# Patient Record
Sex: Female | Born: 1995 | Race: Black or African American | Hispanic: No | Marital: Married | Smoking: Never smoker
Health system: Southern US, Community
[De-identification: ages and names within clinical notes are randomized; demographics above are authoritative.]

## PROBLEM LIST (undated history)

## (undated) ENCOUNTER — Inpatient Hospital Stay (HOSPITAL_COMMUNITY): Payer: Self-pay

## (undated) DIAGNOSIS — I10 Essential (primary) hypertension: Secondary | ICD-10-CM

## (undated) DIAGNOSIS — Z789 Other specified health status: Secondary | ICD-10-CM

## (undated) HISTORY — PX: DILATION AND CURETTAGE OF UTERUS: SHX78

## (undated) HISTORY — DX: Essential (primary) hypertension: I10

---

## 2014-11-07 LAB — OB RESULTS CONSOLE HGB/HCT, BLOOD: Hemoglobin: 12.7 g/dL

## 2014-11-07 LAB — OB RESULTS CONSOLE ANTIBODY SCREEN: Antibody Screen: NEGATIVE

## 2014-11-07 LAB — OB RESULTS CONSOLE ABO/RH: RH TYPE: POSITIVE

## 2015-09-10 NOTE — L&D Delivery Note (Signed)
Delivery Note At 3:48 PM a viable female was delivered via  (Presentation:vertex ;LOA  ).  APGAR:8 , 9; weight  .   Placenta status:spont ,via shultz .  Cord: 3 vc with the following complications: none.  Cord pH: n/a  Anesthesia:  none Episiotomy:  none Lacerations: none  Suture Repair: n/a Est. Blood Loss 200 (mL):    Mom to postpartum.  Baby to Couplet care / Skin to Skin.  [name] 07/06/2016, 3:56 PM

## 2015-11-16 LAB — OB RESULTS CONSOLE HGB/HCT, BLOOD
HEMATOCRIT: 38 %
HEMOGLOBIN: 12.7 g/dL

## 2015-11-16 LAB — OB RESULTS CONSOLE HEPATITIS B SURFACE ANTIGEN: Hepatitis B Surface Ag: NEGATIVE

## 2015-11-16 LAB — OB RESULTS CONSOLE HIV ANTIBODY (ROUTINE TESTING): HIV: NONREACTIVE

## 2015-11-16 LAB — OB RESULTS CONSOLE ANTIBODY SCREEN: Antibody Screen: NEGATIVE

## 2015-11-16 LAB — OB RESULTS CONSOLE ABO/RH: RH TYPE: POSITIVE

## 2015-11-16 LAB — OB RESULTS CONSOLE RPR: RPR: NONREACTIVE

## 2015-11-16 LAB — OB RESULTS CONSOLE PLATELET COUNT: Platelets: 297 10*3/uL

## 2015-11-16 LAB — OB RESULTS CONSOLE RUBELLA ANTIBODY, IGM: Rubella: IMMUNE

## 2016-02-14 ENCOUNTER — Encounter: Payer: Self-pay | Admitting: Certified Nurse Midwife

## 2016-02-14 ENCOUNTER — Ambulatory Visit (INDEPENDENT_AMBULATORY_CARE_PROVIDER_SITE_OTHER): Payer: Medicaid Other | Admitting: Certified Nurse Midwife

## 2016-02-14 VITALS — BP 137/82 | HR 105 | Wt 207.0 lb

## 2016-02-14 DIAGNOSIS — Z3482 Encounter for supervision of other normal pregnancy, second trimester: Secondary | ICD-10-CM

## 2016-02-14 DIAGNOSIS — O0990 Supervision of high risk pregnancy, unspecified, unspecified trimester: Secondary | ICD-10-CM | POA: Insufficient documentation

## 2016-02-14 LAB — POCT URINALYSIS DIPSTICK
BILIRUBIN UA: NEGATIVE
Blood, UA: NEGATIVE
Glucose, UA: NEGATIVE
Ketones, UA: NEGATIVE
Leukocytes, UA: NEGATIVE
Nitrite, UA: NEGATIVE
PH UA: 5
PROTEIN UA: NEGATIVE
Spec Grav, UA: 1.015
Urobilinogen, UA: NEGATIVE

## 2016-02-14 MED ORDER — VITAFOL GUMMIES 3.33-0.333-34.8 MG PO CHEW: 3.0000 | CHEWABLE_TABLET | Freq: Every day | ORAL | Status: DC

## 2016-02-14 NOTE — Addendum Note (Signed)
Addended by: Marya LandryFOSTER, Theodora Lalanne D on: 02/14/2016 04:03 PM   Modules accepted: Orders

## 2016-02-14 NOTE — Progress Notes (Signed)
Subjective:    [name] is being seen today for her first obstetrical visit.  This is a planned pregnancy. She is at 885w5d gestation. Her obstetrical history is significant for obesity. Relationship with FOB: spouse, living together. Patient does intend to breast feed. Pregnancy history fully reviewed.  The information documented in the HPI was reviewed and verified.  Menstrual History: OB History    Gravida Para Term Preterm AB TAB SAB Ectopic Multiple Living   2 0   1  1   0       Patient's last menstrual period was 09/20/2015.    History reviewed. No pertinent past medical history.  Past Surgical History  Procedure Laterality Date  . Dilation and curettage of uterus       (Not in a hospital admission) Not on File  Social History  Substance Use Topics  . Smoking status: Never Smoker   . Smokeless tobacco: Not on file  . Alcohol Use: No    History reviewed. No pertinent family history.   Review of Systems Constitutional: negative for weight loss Gastrointestinal: negative for vomiting Genitourinary:negative for genital lesions and vaginal discharge and dysuria Musculoskeletal:negative for back pain Behavioral/Psych: negative for abusive relationship, depression, illegal drug usage and tobacco use    Objective:    BP 137/82 mmHg  Pulse 105  Wt 207 lb (93.895 kg)  LMP 09/20/2015 General Appearance:    Alert, cooperative, no distress, appears stated age  Head:    Normocephalic, without obvious abnormality, atraumatic  Eyes:    PERRL, conjunctiva/corneas clear, EOM's intact, fundi    benign, both eyes  Ears:    Normal TM's and external ear canals, both ears  Nose:   Nares normal, septum midline, mucosa normal, no drainage    or sinus tenderness  Throat:   Lips, mucosa, and tongue normal; teeth and gums normal  Neck:   Supple, symmetrical, trachea midline, no adenopathy;    thyroid:  no enlargement/tenderness/nodules; no carotid   bruit or JVD  Back:      Symmetric, no curvature, ROM normal, no CVA tenderness  Lungs:     Clear to auscultation bilaterally, respirations unlabored  Chest Wall:    No tenderness or deformity   Heart:    Regular rate and rhythm, S1 and S2 normal, no murmur, rub   or gallop  Breast Exam:    No tenderness, masses, or nipple abnormality  Abdomen:     Soft, non-tender, bowel sounds active all four quadrants,    no masses, no organomegaly  Genitalia:    Normal female without lesion, discharge or tenderness  Extremities:   Extremities normal, atraumatic, no cyanosis or edema  Pulses:   2+ and symmetric all extremities  Skin:   Skin color, texture, turgor normal, no rashes or lesions  Lymph nodes:   Cervical, supraclavicular, and axillary nodes normal  Neurologic:   CNII-XII intact, normal strength, sensation and reflexes    throughout      FHR: 155 by doppler.  FH@U .     Lab Review Urine pregnancy test Labs reviewed yes Radiologic studies reviewed yes Assessment:    Pregnancy at [redacted]w[redacted]d weeks   Transfer at 20 weeks from [place]  Plan:     Prenatal vitamins.  Counseling provided regarding continued use of seat belts, cessation of alcohol consumption, smoking or use of illicit drugs; infection precautions i.e., influenza/TDAP immunizations, toxoplasmosis,CMV, parvovirus, listeria and varicella; workplace safety, exercise during pregnancy; routine dental care, safe medications, sexual activity, hot tubs,  saunas, pools, travel, caffeine use, fish and methlymercury, potential toxins, hair treatments, varicose veins Weight gain recommendations per IOM guidelines reviewed: underweight/BMI< 18.5--> gain 28 - 40 lbs; normal weight/BMI 18.5 - 24.9--> gain 25 - 35 lbs; overweight/BMI 25 - 29.9--> gain 15 - 25 lbs; obese/BMI >30->gain  11 - 20 lbs Problem list reviewed and updated. FIRST/CF mutation testing/NIPT/QUAD SCREEN/fragile X/Ashkenazi Jewish population testing/Spinal muscular atrophy discussed: results reviewed. Role of  ultrasound in pregnancy discussed; fetal survey: ordered. Amniocentesis discussed: not indicated. VBAC calculator score: VBAC consent form provided Meds ordered this encounter  Medications  . prenatal vitamin w/FE, FA (PRENATAL 1 + 1) 27-1 MG TABS tablet    Sig: Take 1 tablet by mouth daily at 12 noon.  . Prenatal Vit-Fe Phos-FA-Omega (VITAFOL GUMMIES) 3.33-0.333-34.8 MG CHEW    Sig: Chew 3 tablets by mouth daily.    Dispense:  90 tablet    Refill:  12   Orders Placed This Encounter  Procedures  . [place] MFM OB COMP + 14 WK    Standing Status: Future     Number of Occurrences:      Standing Expiration Date: 04/15/2017    Order Specific Question:  Reason for Exam (SYMPTOM  OR DIAGNOSIS REQUIRED)    Answer:  fetal anatomy scan, transfer from [place], obesity    Order Specific Question:  Preferred imaging location?    Answer:  [facility]    Follow up in 4 weeks. 50% of 30 min visit spent on counseling and coordination of care.

## 2016-02-14 NOTE — Patient Instructions (Signed)

## 2016-02-19 ENCOUNTER — Other Ambulatory Visit: Payer: Self-pay | Admitting: Certified Nurse Midwife

## 2016-02-19 ENCOUNTER — Ambulatory Visit (HOSPITAL_COMMUNITY)
Admission: RE | Admit: 2016-02-19 | Discharge: 2016-02-19 | Disposition: A | Discharge status: Home / Self Care | Payer: Medicaid Other | Source: Ambulatory Visit | Attending: Certified Nurse Midwife | Admitting: Certified Nurse Midwife

## 2016-02-19 DIAGNOSIS — Z3482 Encounter for supervision of other normal pregnancy, second trimester: Secondary | ICD-10-CM

## 2016-02-19 DIAGNOSIS — Z3A20 20 weeks gestation of pregnancy: Secondary | ICD-10-CM | POA: Insufficient documentation

## 2016-02-19 DIAGNOSIS — Z3689 Encounter for other specified antenatal screening: Secondary | ICD-10-CM

## 2016-02-19 DIAGNOSIS — O99212 Obesity complicating pregnancy, second trimester: Secondary | ICD-10-CM

## 2016-02-19 DIAGNOSIS — Z36 Encounter for antenatal screening of mother: Secondary | ICD-10-CM | POA: Insufficient documentation

## 2016-02-20 ENCOUNTER — Encounter: Payer: Self-pay | Admitting: *Deleted

## 2016-02-21 ENCOUNTER — Other Ambulatory Visit: Payer: Self-pay | Admitting: Certified Nurse Midwife

## 2016-02-26 ENCOUNTER — Telehealth: Payer: Self-pay | Admitting: *Deleted

## 2016-02-26 NOTE — Telephone Encounter (Signed)
Patient states she had a US 2 weeks ago and has not heard anything about it. 3:50 Call to patient- told her everything looks good. She does have a low lying posterior placenta (1.5 cm away from os) and I will check with Boykin ReaperRachelle to see about her activity. She will need to have a repeat US to check it's placement in a few weeks.

## 2016-02-27 ENCOUNTER — Other Ambulatory Visit: Payer: Self-pay | Admitting: Certified Nurse Midwife

## 2016-02-27 NOTE — Telephone Encounter (Signed)
Please tell her no sexual intercourse until the cervix moves away from the OS.  Thank you.  R.Denney CNM

## 2016-02-29 NOTE — Telephone Encounter (Signed)
Patient ask when the next US will be scheduled- told her 6-8 weeks after the last one. Told her I would have you put the order in.

## 2016-03-01 ENCOUNTER — Other Ambulatory Visit: Payer: Self-pay | Admitting: Certified Nurse Midwife

## 2016-03-01 DIAGNOSIS — O0992 Supervision of high risk pregnancy, unspecified, second trimester: Secondary | ICD-10-CM

## 2016-03-01 NOTE — Telephone Encounter (Signed)
Please let her know that the F/U us has been ordered and that Lesle ReekBarb will be giving her a call.  Thank you.  R.Eloisa Chokshi CNM

## 2016-03-13 ENCOUNTER — Ambulatory Visit (INDEPENDENT_AMBULATORY_CARE_PROVIDER_SITE_OTHER): Payer: Medicaid Other | Admitting: Certified Nurse Midwife

## 2016-03-13 VITALS — BP 123/76 | HR 87 | Wt 217.0 lb

## 2016-03-13 DIAGNOSIS — Z3482 Encounter for supervision of other normal pregnancy, second trimester: Secondary | ICD-10-CM

## 2016-03-13 LAB — POCT URINALYSIS DIPSTICK
BILIRUBIN UA: NEGATIVE
GLUCOSE UA: NEGATIVE
KETONES UA: NEGATIVE
Leukocytes, UA: NEGATIVE
Nitrite, UA: NEGATIVE
PH UA: 6
RBC UA: NEGATIVE
Spec Grav, UA: 1.02
Urobilinogen, UA: NEGATIVE

## 2016-03-13 NOTE — Progress Notes (Signed)
Subjective:    [name] is a 20 y.o. female being seen today for her obstetrical visit. She is at 3120w5d gestation. Patient reports: no complaints . Fetal movement: normal.  Problem List Items Addressed This Visit      Other   Encounter for supervision of other normal pregnancy in second trimester - Primary   Relevant Orders   US MFM OB FOLLOW UP     Patient Active Problem List   Diagnosis Date Noted  . Encounter for supervision of other normal pregnancy in second trimester 02/14/2016   Objective:    BP 123/76 mmHg  Pulse 87  Wt 217 lb (98.431 kg)  LMP 09/20/2015 FHT: 155 BPM  Uterine Size: 27 cm and size greater than dates     Assessment:    Pregnancy @ 4020w5d    Placenta previa  S>D  Plan:    OBGCT: discussed and ordered for next visit. Signs and symptoms of preterm labor: discussed.  Labs, problem list reviewed and updated 2 hr GTT planned Follow up in 4 weeks.

## 2016-03-13 NOTE — Addendum Note (Signed)
Addended by: Marya LandryFOSTER, Ethal Gotay D on: 03/13/2016 05:20 PM   Modules accepted: Orders

## 2016-03-16 ENCOUNTER — Encounter (HOSPITAL_COMMUNITY): Payer: Self-pay

## 2016-03-16 ENCOUNTER — Inpatient Hospital Stay (HOSPITAL_COMMUNITY)
Admission: AD | Admit: 2016-03-16 | Discharge: 2016-03-16 | Disposition: A | Discharge status: Home / Self Care | Payer: Medicaid Other | Source: Ambulatory Visit | Attending: Obstetrics and Gynecology | Admitting: Obstetrics and Gynecology

## 2016-03-16 DIAGNOSIS — O26892 Other specified pregnancy related conditions, second trimester: Secondary | ICD-10-CM | POA: Diagnosis not present

## 2016-03-16 DIAGNOSIS — Z3A24 24 weeks gestation of pregnancy: Secondary | ICD-10-CM | POA: Insufficient documentation

## 2016-03-16 DIAGNOSIS — O26899 Other specified pregnancy related conditions, unspecified trimester: Secondary | ICD-10-CM

## 2016-03-16 DIAGNOSIS — R109 Unspecified abdominal pain: Secondary | ICD-10-CM | POA: Diagnosis not present

## 2016-03-16 DIAGNOSIS — O368120 Decreased fetal movements, second trimester, not applicable or unspecified: Secondary | ICD-10-CM | POA: Insufficient documentation

## 2016-03-16 DIAGNOSIS — O9989 Other specified diseases and conditions complicating pregnancy, childbirth and the puerperium: Secondary | ICD-10-CM | POA: Diagnosis not present

## 2016-03-16 LAB — URINALYSIS, ROUTINE W REFLEX MICROSCOPIC
Bilirubin Urine: NEGATIVE
GLUCOSE, UA: NEGATIVE mg/dL
HGB URINE DIPSTICK: NEGATIVE
Ketones, ur: NEGATIVE mg/dL
Leukocytes, UA: NEGATIVE
Nitrite: NEGATIVE
PH: 7 (ref 5.0–8.0)
PROTEIN: NEGATIVE mg/dL
Specific Gravity, Urine: 1.015 (ref 1.005–1.030)

## 2016-03-16 NOTE — MAU Note (Signed)
Has had decreased fetal movement for 2 days. Slight cramping 4/10

## 2016-03-16 NOTE — MAU Provider Note (Signed)
History   G2P0010 @ 24.1 wks in with abd cramping for several days. Also decreased fetal movement  CSN: [id number]  Arrival date & time 03/16/16  1558   First Provider Initiated Contact with Patient 03/16/16 1631      Chief Complaint  Patient presents with  . Decreased Fetal Movement  . Abdominal Cramping    HPI  History reviewed. No pertinent past medical history.  Past Surgical History  Procedure Laterality Date  . Dilation and curettage of uterus      History reviewed. No pertinent family history.  Social History  Substance Use Topics  . Smoking status: Never Smoker   . Smokeless tobacco: None  . Alcohol Use: No    OB History    Gravida Para Term Preterm AB TAB SAB Ectopic Multiple Living   2 0   1  1   0      Review of Systems  Constitutional: Negative.   HENT: Negative.   Eyes: Negative.   Respiratory: Negative.   Cardiovascular: Negative.   Gastrointestinal: Positive for abdominal pain.  Endocrine: Negative.   Genitourinary: Negative.   Musculoskeletal: Negative.   Skin: Negative.   Allergic/Immunologic: Negative.   Neurological: Negative.   Hematological: Negative.   Psychiatric/Behavioral: Negative.     Allergies  Review of patient's allergies indicates no known allergies.  Home Medications  No current outpatient prescriptions on file.  Temp(Src) 98.7 F (37.1 C) (Oral)  Resp 16  LMP 09/20/2015  Physical Exam  Constitutional: She is oriented to person, place, and time. She appears well-developed and well-nourished.  HENT:  Head: Normocephalic.  Eyes: Pupils are equal, round, and reactive to light.  Neck: Normal range of motion.  Cardiovascular: Normal rate, regular rhythm, normal heart sounds and intact distal pulses.   Pulmonary/Chest: Effort normal and breath sounds normal.  Abdominal: Soft. Bowel sounds are normal.  Genitourinary: Vagina normal and uterus normal.  Musculoskeletal: Normal range of motion.  Neurological: She is  alert and oriented to person, place, and time. She has normal reflexes.  Skin: Skin is warm and dry.  Psychiatric: She has a normal mood and affect. Her behavior is normal. Judgment and thought content normal.    MAU Course  Procedures (including critical care time)  Labs Reviewed  URINALYSIS, ROUTINE W REFLEX MICROSCOPIC (NOT AT [facility])   No results found.   No diagnosis found.    MDM  FHR pattern reassurring. SVE cl/firm/th/post/high. No uc's will d/c home pt instructed that if pain worsens or fetus does not move after drinking something sweet to return to MAU

## 2016-03-16 NOTE — Discharge Instructions (Signed)

## 2016-03-25 NOTE — Telephone Encounter (Signed)
Patient scheduled 7/18 for [place]

## 2016-03-26 ENCOUNTER — Encounter (HOSPITAL_COMMUNITY): Payer: Self-pay

## 2016-03-26 ENCOUNTER — Ambulatory Visit (HOSPITAL_COMMUNITY)
Admission: RE | Admit: 2016-03-26 | Discharge: 2016-03-26 | Disposition: A | Discharge status: Home / Self Care | Payer: Medicaid Other | Source: Ambulatory Visit | Attending: Certified Nurse Midwife | Admitting: Certified Nurse Midwife

## 2016-03-26 ENCOUNTER — Other Ambulatory Visit: Payer: Self-pay | Admitting: Certified Nurse Midwife

## 2016-03-26 VITALS — BP 119/67 | HR 88 | Wt 224.1 lb

## 2016-03-26 DIAGNOSIS — O99212 Obesity complicating pregnancy, second trimester: Secondary | ICD-10-CM | POA: Diagnosis not present

## 2016-03-26 DIAGNOSIS — O0992 Supervision of high risk pregnancy, unspecified, second trimester: Secondary | ICD-10-CM

## 2016-03-26 DIAGNOSIS — Z3A25 25 weeks gestation of pregnancy: Secondary | ICD-10-CM | POA: Insufficient documentation

## 2016-03-26 DIAGNOSIS — O365990 Maternal care for other known or suspected poor fetal growth, unspecified trimester, not applicable or unspecified: Secondary | ICD-10-CM

## 2016-03-26 DIAGNOSIS — O4442 Low lying placenta NOS or without hemorrhage, second trimester: Secondary | ICD-10-CM | POA: Insufficient documentation

## 2016-04-02 ENCOUNTER — Other Ambulatory Visit (HOSPITAL_COMMUNITY): Payer: Self-pay | Admitting: Maternal and Fetal Medicine

## 2016-04-02 ENCOUNTER — Ambulatory Visit (HOSPITAL_COMMUNITY)
Admission: RE | Admit: 2016-04-02 | Discharge: 2016-04-02 | Disposition: A | Discharge status: Home / Self Care | Payer: Medicaid Other | Source: Ambulatory Visit | Attending: Certified Nurse Midwife | Admitting: Certified Nurse Midwife

## 2016-04-02 ENCOUNTER — Encounter (HOSPITAL_COMMUNITY): Payer: Self-pay

## 2016-04-02 DIAGNOSIS — O365929 Maternal care for other known or suspected poor fetal growth, second trimester, other fetus: Secondary | ICD-10-CM

## 2016-04-02 DIAGNOSIS — O365920 Maternal care for other known or suspected poor fetal growth, second trimester, not applicable or unspecified: Secondary | ICD-10-CM | POA: Insufficient documentation

## 2016-04-02 DIAGNOSIS — O365990 Maternal care for other known or suspected poor fetal growth, unspecified trimester, not applicable or unspecified: Secondary | ICD-10-CM

## 2016-04-02 DIAGNOSIS — O99212 Obesity complicating pregnancy, second trimester: Secondary | ICD-10-CM | POA: Diagnosis not present

## 2016-04-02 DIAGNOSIS — Z3A26 26 weeks gestation of pregnancy: Secondary | ICD-10-CM | POA: Diagnosis not present

## 2016-04-09 ENCOUNTER — Encounter (HOSPITAL_COMMUNITY): Payer: Self-pay

## 2016-04-09 ENCOUNTER — Ambulatory Visit (HOSPITAL_COMMUNITY)
Admission: RE | Admit: 2016-04-09 | Discharge: 2016-04-09 | Disposition: A | Discharge status: Home / Self Care | Payer: Medicaid Other | Source: Ambulatory Visit | Attending: Certified Nurse Midwife | Admitting: Certified Nurse Midwife

## 2016-04-09 DIAGNOSIS — O365990 Maternal care for other known or suspected poor fetal growth, unspecified trimester, not applicable or unspecified: Secondary | ICD-10-CM

## 2016-04-09 DIAGNOSIS — Z3A27 27 weeks gestation of pregnancy: Secondary | ICD-10-CM | POA: Diagnosis not present

## 2016-04-09 DIAGNOSIS — O365930 Maternal care for other known or suspected poor fetal growth, third trimester, not applicable or unspecified: Secondary | ICD-10-CM | POA: Insufficient documentation

## 2016-04-09 DIAGNOSIS — O99212 Obesity complicating pregnancy, second trimester: Secondary | ICD-10-CM | POA: Diagnosis not present

## 2016-04-10 ENCOUNTER — Ambulatory Visit (INDEPENDENT_AMBULATORY_CARE_PROVIDER_SITE_OTHER): Payer: Medicaid Other | Admitting: Certified Nurse Midwife

## 2016-04-10 ENCOUNTER — Other Ambulatory Visit: Payer: Medicaid Other

## 2016-04-10 VITALS — BP 122/78 | HR 80 | Temp 98.9°F | Wt 228.0 lb

## 2016-04-10 DIAGNOSIS — Z3402 Encounter for supervision of normal first pregnancy, second trimester: Secondary | ICD-10-CM

## 2016-04-10 DIAGNOSIS — Z3482 Encounter for supervision of other normal pregnancy, second trimester: Secondary | ICD-10-CM

## 2016-04-10 DIAGNOSIS — O365990 Maternal care for other known or suspected poor fetal growth, unspecified trimester, not applicable or unspecified: Secondary | ICD-10-CM | POA: Insufficient documentation

## 2016-04-10 DIAGNOSIS — O365933 Maternal care for other known or suspected poor fetal growth, third trimester, fetus 3: Secondary | ICD-10-CM

## 2016-04-10 LAB — POCT URINALYSIS DIPSTICK
BILIRUBIN UA: NEGATIVE
Blood, UA: NEGATIVE
GLUCOSE UA: NEGATIVE
KETONES UA: NEGATIVE
Nitrite, UA: NEGATIVE
Protein, UA: NEGATIVE
Spec Grav, UA: 1.015
Urobilinogen, UA: 0.2
pH, UA: 5

## 2016-04-10 NOTE — Progress Notes (Signed)
Patient reports she has no concerns today 

## 2016-04-10 NOTE — Progress Notes (Signed)
Subjective:  [name] is a 20 y.o. G2P0010 at [redacted]w[redacted]d being seen today for ongoing prenatal care.  She is currently monitored for the following issues for this low-risk pregnancy and has Encounter for supervision of other normal pregnancy in second trimester on her problem list.  Patient reports no complaints.  Contractions: Not present. Vag. Bleeding: None.  Movement: Present. Denies leaking of fluid.   The following portions of the patient's history were reviewed and updated as appropriate: allergies, current medications, past family history, past medical history, past social history, past surgical history and problem list. Problem list updated.  Objective:   Vitals:   04/10/16 0955  BP: 122/78  Pulse: 80  Temp: 98.9 F (37.2 C)  Weight: 228 lb (103.4 kg)    Fetal Status: Fetal Heart Rate (bpm): 152 Fundal Height: 27 cm Movement: Present     General:  Alert, oriented and cooperative. Patient is in no acute distress.  Skin: Skin is warm and dry. No rash noted.   Cardiovascular: Normal heart rate noted  Respiratory: Normal respiratory effort, no problems with respiration noted  Abdomen: Soft, gravid, appropriate for gestational age. Pain/Pressure: Absent     Pelvic:  Cervical exam deferred        Extremities: Normal range of motion.  Edema: None  Mental Status: Normal mood and affect. Normal behavior. Normal judgment and thought content.   Urinalysis:      Assessment and Plan:  Pregnancy: G2P0010 at [redacted]w[redacted]d  1. Encounter for supervision of normal first pregnancy in second trimester  - Glucose Tolerance, 2 Hours w/1 Hour - CBC - HIV antibody - RPR - POCT urinalysis dipstick  Preterm labor symptoms and general obstetric precautions including but not limited to vaginal bleeding, contractions, leaking of fluid and fetal movement were reviewed in detail with the patient. Please refer to After Visit Summary for other counseling recommendations.  F/U 2 weeks   Dorathy Kinsman, [place]

## 2016-04-10 NOTE — Patient Instructions (Signed)
Intrauterine Growth Restriction °Intrauterine growth restriction (IUGR) is when your baby is not growing normally during your pregnancy. A baby with IUGR is smaller than it should be and may weigh less than normal at birth.  °IUGR can result if there is a problem with the organ that supplies your baby with oxygen and nutrition (placenta). Usually, there is no way to prevent this type of problem.  °CAUSES  °The most common cause of IUGR is a problem with the placenta or umbilical cord that causes your developing baby to get less oxygen or nutrition than needed. Other causes include: °· Poor maternal nutrition. °· Chemicals found in substances such as cigarettes, alcohol, and some illegal drugs. °· Some prescription medicines. °· Congenital defects. °· Genetic disorders. °· An infection. °· Carrying more than one baby, such as twins or triplets (multiple gestations). °RISK FACTORS °This condition is more likely to develop in women who: °· Are over the age of 35 at the time of delivery (advanced maternal age). °· Have medical conditions such as high blood pressure, diabetes, heart or kidney disease, anemia, or conditions that increase the risk for blood clotting. °· Live at a very high altitude during pregnancy. °· Have a personal history or family history of IUGR. °· Take medicines during pregnancy that are linked with congenital disabilities. °· Have a personal or family history of a genetic disorder. °· Come into contact with infected cat feces (toxoplasmosis). °· Come into contact with chickenpox (varicella) or German measles (rubella). °· Have or are at risk of getting an infectious disease such as syphilis, HIV, or herpes. °· Eat poorly during their pregnancy. °· Weigh less than 100 pounds. °· Have a family history of multiple gestations. °· Have had infertility treatments.   °· Use tobacco, illegal drugs, or drink alcohol during pregnancy. °SYMPTOMS °IUGR does not cause many symptoms. You might notice that your  baby does not move or kick very often. Also, your belly may not be as big as expected for the stage of your pregnancy. °DIAGNOSIS °This condition is diagnosed with physical and prenatal exams. Your health care provider will measure the size of your baby inside your womb during a routine screening exam using sound waves (ultrasound). Your health care provider will compare the size of your baby to the size of other babies at the same stage of development (gestational age). Your health care provider will diagnose IUGR if your baby is smaller than 90 percent of all other babies at the same gestational age.    °You may also have tests to find the cause of IUGR. These can include: °· Having fluid removed from your womb to check for signs of infection or a congenital disability (amniocentesis). °· A series of tests to monitor your baby's health (fetal monitoring). °TREATMENT  °In most cases, treatment for this condition focuses on stopping the cause of your baby's small growth. Your health care providers will monitor your pregnancy closely and help you manage your pregnancy. If this condition is caused by a placenta problem and the baby is not getting enough blood, treatment may include:  °· Medicine to start labor and deliver your baby early (induction). °· Cesarean delivery. In this procedure, your baby is delivered through a cut (incision) in the abdomen and womb (uterus). °HOME CARE INSTRUCTIONS  °· Make sure you are eating enough calories and gaining enough weight. °· Eat a balanced diet. Work with a nutrition specialist (dietitian), if needed.   °· Rest as needed. Try to get at least eight   hours of sleep every night. °· Do not drink alcohol.   °· Do not use illegal drugs.   °· Do not use any tobacco products, including cigarettes, chewing tobacco, or e-cigarettes. If you need help quitting, ask your health care provider. °· Talk to your health care provider about steps you can take to avoid infections. °· Take  medicines, vitamins, and mineral supplements only as directed by your health care provider. Make sure that your health care provider knows about all of the prescribed or over-the-counter medicines, supplements, vitamins, eye drops, and creams that you are using. °· Keep all follow-up visits as directed by your health care provider. This is important. °SEEK MEDICAL CARE IF:  °· Your baby is not moving as often as before. °  °This information is not intended to replace advice given to you by your health care provider. Make sure you discuss any questions you have with your health care provider. °  °Document Released: 06/04/2008 Document Revised: 01/10/2015 Document Reviewed: 08/22/2014 °Elsevier Interactive Patient Education ©2016 [employer] ° °

## 2016-04-11 LAB — CBC
HEMATOCRIT: 38.9 % (ref 34.0–46.6)
HEMOGLOBIN: 12.5 g/dL (ref 11.1–15.9)
MCH: 28.7 pg (ref 26.6–33.0)
MCHC: 32.1 g/dL (ref 31.5–35.7)
MCV: 89 fL (ref 79–97)
Platelets: 282 10*3/uL (ref 150–379)
RBC: 4.36 x10E6/uL (ref 3.77–5.28)
RDW: 13.7 % (ref 12.3–15.4)
WBC: 7.5 10*3/uL (ref 3.4–10.8)

## 2016-04-11 LAB — HIV ANTIBODY (ROUTINE TESTING W REFLEX): HIV Screen 4th Generation wRfx: NONREACTIVE

## 2016-04-11 LAB — GLUCOSE TOLERANCE, 2 HOURS W/ 1HR
GLUCOSE, 2 HOUR: 79 mg/dL (ref 65–152)
Glucose, 1 hour: 106 mg/dL (ref 65–179)
Glucose, Fasting: 75 mg/dL (ref 65–91)

## 2016-04-11 LAB — RPR: RPR: NONREACTIVE

## 2016-04-16 ENCOUNTER — Encounter (HOSPITAL_COMMUNITY): Payer: Self-pay

## 2016-04-16 ENCOUNTER — Ambulatory Visit (HOSPITAL_COMMUNITY)
Admission: RE | Admit: 2016-04-16 | Discharge: 2016-04-16 | Disposition: A | Discharge status: Home / Self Care | Payer: Medicaid Other | Source: Ambulatory Visit | Attending: Certified Nurse Midwife | Admitting: Certified Nurse Midwife

## 2016-04-16 ENCOUNTER — Other Ambulatory Visit (HOSPITAL_COMMUNITY): Payer: Self-pay | Admitting: Maternal and Fetal Medicine

## 2016-04-16 DIAGNOSIS — O99213 Obesity complicating pregnancy, third trimester: Secondary | ICD-10-CM | POA: Insufficient documentation

## 2016-04-16 DIAGNOSIS — O365930 Maternal care for other known or suspected poor fetal growth, third trimester, not applicable or unspecified: Secondary | ICD-10-CM | POA: Diagnosis not present

## 2016-04-16 DIAGNOSIS — E669 Obesity, unspecified: Secondary | ICD-10-CM | POA: Diagnosis not present

## 2016-04-16 DIAGNOSIS — O365990 Maternal care for other known or suspected poor fetal growth, unspecified trimester, not applicable or unspecified: Secondary | ICD-10-CM

## 2016-04-16 DIAGNOSIS — Z3A28 28 weeks gestation of pregnancy: Secondary | ICD-10-CM | POA: Insufficient documentation

## 2016-04-17 ENCOUNTER — Other Ambulatory Visit (HOSPITAL_COMMUNITY): Payer: Self-pay | Admitting: *Deleted

## 2016-04-23 ENCOUNTER — Encounter (HOSPITAL_COMMUNITY): Payer: Self-pay

## 2016-04-23 ENCOUNTER — Other Ambulatory Visit (HOSPITAL_COMMUNITY): Payer: Self-pay | Admitting: Maternal and Fetal Medicine

## 2016-04-23 ENCOUNTER — Ambulatory Visit (HOSPITAL_COMMUNITY)
Admission: RE | Admit: 2016-04-23 | Discharge: 2016-04-23 | Disposition: A | Discharge status: Home / Self Care | Payer: Medicaid Other | Source: Ambulatory Visit | Attending: Certified Nurse Midwife | Admitting: Certified Nurse Midwife

## 2016-04-23 DIAGNOSIS — O365990 Maternal care for other known or suspected poor fetal growth, unspecified trimester, not applicable or unspecified: Secondary | ICD-10-CM

## 2016-04-23 DIAGNOSIS — O365930 Maternal care for other known or suspected poor fetal growth, third trimester, not applicable or unspecified: Secondary | ICD-10-CM | POA: Diagnosis present

## 2016-04-23 DIAGNOSIS — Z3A29 29 weeks gestation of pregnancy: Secondary | ICD-10-CM

## 2016-04-25 ENCOUNTER — Ambulatory Visit (INDEPENDENT_AMBULATORY_CARE_PROVIDER_SITE_OTHER): Payer: Medicaid Other | Admitting: Certified Nurse Midwife

## 2016-04-25 DIAGNOSIS — Z3482 Encounter for supervision of other normal pregnancy, second trimester: Secondary | ICD-10-CM | POA: Diagnosis not present

## 2016-04-25 LAB — POCT URINALYSIS DIPSTICK
BILIRUBIN UA: NEGATIVE
Blood, UA: NEGATIVE
GLUCOSE UA: NEGATIVE
KETONES UA: NEGATIVE
Leukocytes, UA: NEGATIVE
Nitrite, UA: NEGATIVE
Protein, UA: NEGATIVE
SPEC GRAV UA: 1.015
Urobilinogen, UA: NEGATIVE
pH, UA: 7

## 2016-04-25 MED ORDER — PRENATE PIXIE 10-0.6-0.4-200 MG PO CAPS: 1.0000 | ORAL_CAPSULE | Freq: Every day | ORAL | 12 refills | Status: DC

## 2016-04-25 MED ORDER — TETANUS-DIPHTH-ACELL PERTUSSIS 5-2.5-18.5 LF-MCG/0.5 IM SUSP
0.5000 mL | Freq: Once | INTRAMUSCULAR | Status: AC
  Administered 2016-04-25: 0.5 mL via INTRAMUSCULAR

## 2016-04-25 NOTE — Progress Notes (Signed)
Subjective:    [name] is a 20 y.o. female being seen today for her obstetrical visit. She is at 5224w6d gestation. Patient reports no complaints. Fetal movement: normal.  Problem List Items Addressed This Visit      Other   Encounter for supervision of other normal pregnancy in second trimester   Relevant Medications   Tdap (BOOSTRIX) injection 0.5 mL (Completed)   Prenat-FeAsp-Meth-FA-DHA w/o A (PRENATE PIXIE) 10-0.6-0.4-200 MG CAPS   Other Relevant Orders   POCT urinalysis dipstick (Completed)    Other Visit Diagnoses   None.    Patient Active Problem List   Diagnosis Date Noted  . SGA (small for gestational age), fetal, affecting care of mother, antepartum 04/10/2016  . Encounter for supervision of other normal pregnancy in second trimester 02/14/2016   Objective:    BP 124/76   Pulse 94   Temp 98.5 F (36.9 C)   Wt 230 lb 14.4 oz (104.7 kg)   LMP 09/20/2015  FHT:  135 BPM  Uterine Size: 29 cm and size equals dates  Presentation: cephalic     Assessment:    Pregnancy @ 10824w6d weeks   SGA by US  Plan:    MFM serial US   NST's starting @32  weeks   labs reviewed, problem list updated Consent signed. GBS planning TDAP offered & given, declines influenza vacciene Rhogam given for RH negative Pediatrician: discussed. Infant feeding: plans to breastfeed. Maternity leave: discussed. Cigarette smoking: never smoked. Orders Placed This Encounter  Procedures  . POCT urinalysis dipstick   Meds ordered this encounter  Medications  . Tdap (BOOSTRIX) injection 0.5 mL  . Prenat-FeAsp-Meth-FA-DHA w/o A (PRENATE PIXIE) 10-0.6-0.4-200 MG CAPS    Sig: Take 1 tablet by mouth daily.    Dispense:  30 capsule    Refill:  12    Please process coupon: Rx BIN: [id number], RxPCN: [id number], RxGRP: [id number]: [id number], Rx: [id number]: [id number]  SUF: 01   Follow up in 2 Weeks with NST.

## 2016-04-25 NOTE — Progress Notes (Signed)
Pt denies concerns at this time. 

## 2016-04-30 ENCOUNTER — Ambulatory Visit (HOSPITAL_COMMUNITY)
Admission: RE | Admit: 2016-04-30 | Discharge: 2016-04-30 | Disposition: A | Discharge status: Home / Self Care | Payer: Medicaid Other | Source: Ambulatory Visit | Attending: Certified Nurse Midwife | Admitting: Certified Nurse Midwife

## 2016-04-30 ENCOUNTER — Encounter (HOSPITAL_COMMUNITY): Payer: Self-pay

## 2016-04-30 DIAGNOSIS — Z3A30 30 weeks gestation of pregnancy: Secondary | ICD-10-CM | POA: Diagnosis not present

## 2016-04-30 DIAGNOSIS — O99213 Obesity complicating pregnancy, third trimester: Secondary | ICD-10-CM | POA: Diagnosis not present

## 2016-04-30 DIAGNOSIS — O365930 Maternal care for other known or suspected poor fetal growth, third trimester, not applicable or unspecified: Secondary | ICD-10-CM | POA: Insufficient documentation

## 2016-04-30 DIAGNOSIS — O365990 Maternal care for other known or suspected poor fetal growth, unspecified trimester, not applicable or unspecified: Secondary | ICD-10-CM

## 2016-05-06 ENCOUNTER — Encounter (HOSPITAL_COMMUNITY): Payer: Self-pay

## 2016-05-06 ENCOUNTER — Ambulatory Visit (HOSPITAL_COMMUNITY)
Admission: RE | Admit: 2016-05-06 | Discharge: 2016-05-06 | Disposition: A | Discharge status: Home / Self Care | Payer: Medicaid Other | Source: Ambulatory Visit | Attending: Certified Nurse Midwife | Admitting: Certified Nurse Midwife

## 2016-05-06 ENCOUNTER — Other Ambulatory Visit (HOSPITAL_COMMUNITY): Payer: Self-pay | Admitting: Maternal and Fetal Medicine

## 2016-05-06 ENCOUNTER — Other Ambulatory Visit (HOSPITAL_COMMUNITY): Payer: Self-pay | Admitting: *Deleted

## 2016-05-06 DIAGNOSIS — O99213 Obesity complicating pregnancy, third trimester: Secondary | ICD-10-CM

## 2016-05-06 DIAGNOSIS — Z3A31 31 weeks gestation of pregnancy: Secondary | ICD-10-CM

## 2016-05-06 DIAGNOSIS — O365930 Maternal care for other known or suspected poor fetal growth, third trimester, not applicable or unspecified: Secondary | ICD-10-CM

## 2016-05-06 DIAGNOSIS — O365990 Maternal care for other known or suspected poor fetal growth, unspecified trimester, not applicable or unspecified: Secondary | ICD-10-CM

## 2016-05-06 DIAGNOSIS — E669 Obesity, unspecified: Secondary | ICD-10-CM | POA: Diagnosis not present

## 2016-05-06 HISTORY — DX: Other specified health status: Z78.9

## 2016-05-10 ENCOUNTER — Encounter: Payer: Medicaid Other | Admitting: Obstetrics

## 2016-05-14 ENCOUNTER — Encounter (HOSPITAL_COMMUNITY): Payer: Self-pay

## 2016-05-14 ENCOUNTER — Ambulatory Visit (HOSPITAL_COMMUNITY)
Admission: RE | Admit: 2016-05-14 | Discharge: 2016-05-14 | Disposition: A | Discharge status: Home / Self Care | Payer: Medicaid Other | Source: Ambulatory Visit | Attending: Certified Nurse Midwife | Admitting: Certified Nurse Midwife

## 2016-05-14 DIAGNOSIS — O99213 Obesity complicating pregnancy, third trimester: Secondary | ICD-10-CM | POA: Diagnosis not present

## 2016-05-14 DIAGNOSIS — Z3A32 32 weeks gestation of pregnancy: Secondary | ICD-10-CM | POA: Diagnosis not present

## 2016-05-14 DIAGNOSIS — O365990 Maternal care for other known or suspected poor fetal growth, unspecified trimester, not applicable or unspecified: Secondary | ICD-10-CM

## 2016-05-14 DIAGNOSIS — O365930 Maternal care for other known or suspected poor fetal growth, third trimester, not applicable or unspecified: Secondary | ICD-10-CM | POA: Diagnosis not present

## 2016-05-21 ENCOUNTER — Encounter (HOSPITAL_COMMUNITY): Payer: Self-pay

## 2016-05-21 ENCOUNTER — Ambulatory Visit (HOSPITAL_COMMUNITY)
Admission: RE | Admit: 2016-05-21 | Discharge: 2016-05-21 | Disposition: A | Discharge status: Home / Self Care | Payer: Medicaid Other | Source: Ambulatory Visit | Attending: Certified Nurse Midwife | Admitting: Certified Nurse Midwife

## 2016-05-21 DIAGNOSIS — O99213 Obesity complicating pregnancy, third trimester: Secondary | ICD-10-CM | POA: Diagnosis not present

## 2016-05-21 DIAGNOSIS — Z3A33 33 weeks gestation of pregnancy: Secondary | ICD-10-CM | POA: Insufficient documentation

## 2016-05-21 DIAGNOSIS — O365990 Maternal care for other known or suspected poor fetal growth, unspecified trimester, not applicable or unspecified: Secondary | ICD-10-CM

## 2016-05-21 DIAGNOSIS — O365930 Maternal care for other known or suspected poor fetal growth, third trimester, not applicable or unspecified: Secondary | ICD-10-CM | POA: Diagnosis present

## 2016-05-28 ENCOUNTER — Other Ambulatory Visit (HOSPITAL_COMMUNITY): Payer: Medicaid Other

## 2016-06-04 ENCOUNTER — Encounter: Payer: Self-pay | Admitting: Obstetrics

## 2016-06-11 ENCOUNTER — Ambulatory Visit (HOSPITAL_COMMUNITY)
Admission: RE | Admit: 2016-06-11 | Discharge: 2016-06-11 | Disposition: A | Discharge status: Home / Self Care | Payer: Medicaid Other | Source: Ambulatory Visit | Attending: Certified Nurse Midwife | Admitting: Certified Nurse Midwife

## 2016-06-11 ENCOUNTER — Encounter (HOSPITAL_COMMUNITY): Payer: Self-pay

## 2016-06-11 DIAGNOSIS — O99213 Obesity complicating pregnancy, third trimester: Secondary | ICD-10-CM | POA: Insufficient documentation

## 2016-06-11 DIAGNOSIS — O365930 Maternal care for other known or suspected poor fetal growth, third trimester, not applicable or unspecified: Secondary | ICD-10-CM | POA: Diagnosis not present

## 2016-06-11 DIAGNOSIS — Z3A36 36 weeks gestation of pregnancy: Secondary | ICD-10-CM | POA: Diagnosis not present

## 2016-06-11 DIAGNOSIS — O365990 Maternal care for other known or suspected poor fetal growth, unspecified trimester, not applicable or unspecified: Secondary | ICD-10-CM

## 2016-06-12 ENCOUNTER — Ambulatory Visit (INDEPENDENT_AMBULATORY_CARE_PROVIDER_SITE_OTHER): Payer: Medicaid Other | Admitting: Certified Nurse Midwife

## 2016-06-12 DIAGNOSIS — O365930 Maternal care for other known or suspected poor fetal growth, third trimester, not applicable or unspecified: Secondary | ICD-10-CM | POA: Diagnosis not present

## 2016-06-12 DIAGNOSIS — O0993 Supervision of high risk pregnancy, unspecified, third trimester: Secondary | ICD-10-CM

## 2016-06-12 DIAGNOSIS — Z3483 Encounter for supervision of other normal pregnancy, third trimester: Secondary | ICD-10-CM | POA: Diagnosis not present

## 2016-06-12 DIAGNOSIS — O365933 Maternal care for other known or suspected poor fetal growth, third trimester, fetus 3: Secondary | ICD-10-CM

## 2016-06-12 NOTE — Progress Notes (Signed)
Subjective:    [name] is a 20 y.o. female being seen today for her obstetrical visit. She is at 6040w5d gestation. Patient reports no complaints. Fetal movement: normal.  Problem List Items Addressed This Visit      Other   Supervision of high-risk pregnancy    Other Visit Diagnoses    SGA (small for gestational age), fetal, affecting care of mother, antepartum, third trimester, fetus 3    -  Primary   Encounter for supervision of other normal pregnancy in third trimester         Patient Active Problem List   Diagnosis Date Noted  . SGA (small for gestational age), fetal, affecting care of mother, antepartum 04/10/2016  . Supervision of high-risk pregnancy 02/14/2016   Objective:    LMP 09/20/2015  FHT:  155 BPM  Uterine Size: 36 cm and size equals dates  Presentation: cephalic    NST: + accels, no decels, moderate variability, Cat. 1 tracing. No contractions on toco.   Assessment:    Pregnancy @ 6540w5d weeks   Reactive NST  Plan:     labs reviewed, problem list updated Consent signed. GBS sent TDAP offered  Rhogam given for RH negative Pediatrician: discussed. Infant feeding: plans to breastfeed. Maternity leave: discussed. Cigarette smoking: never smoked. No orders of the defined types were placed in this encounter.  No orders of the defined types were placed in this encounter.  Follow up in 1 Week with NST.

## 2016-06-13 ENCOUNTER — Other Ambulatory Visit (HOSPITAL_COMMUNITY)
Admission: RE | Admit: 2016-06-13 | Discharge: 2016-06-13 | Disposition: A | Discharge status: Home / Self Care | Payer: Medicaid Other | Source: Ambulatory Visit | Attending: Certified Nurse Midwife | Admitting: Certified Nurse Midwife

## 2016-06-13 DIAGNOSIS — Z113 Encounter for screening for infections with a predominantly sexual mode of transmission: Secondary | ICD-10-CM | POA: Insufficient documentation

## 2016-06-13 LAB — OB RESULTS CONSOLE GBS: GBS: NEGATIVE

## 2016-06-13 LAB — OB RESULTS CONSOLE GC/CHLAMYDIA: GC PROBE AMP, GENITAL: NEGATIVE

## 2016-06-13 NOTE — Addendum Note (Signed)
Addended by: Marya LandryFOSTER, SUZANNE D on: 06/13/2016 02:49 PM   Modules accepted: Orders

## 2016-06-14 LAB — GC/CHLAMYDIA PROBE AMP (~~LOC~~) NOT AT ARMC
Chlamydia: NEGATIVE
NEISSERIA GONORRHEA: NEGATIVE

## 2016-06-15 LAB — STREP GP B NAA: STREP GROUP B AG: NEGATIVE

## 2016-06-19 ENCOUNTER — Ambulatory Visit (INDEPENDENT_AMBULATORY_CARE_PROVIDER_SITE_OTHER): Payer: Medicaid Other | Admitting: Certified Nurse Midwife

## 2016-06-19 VITALS — BP 134/88 | HR 92 | Temp 97.6°F | Wt 252.2 lb

## 2016-06-19 DIAGNOSIS — O365930 Maternal care for other known or suspected poor fetal growth, third trimester, not applicable or unspecified: Secondary | ICD-10-CM | POA: Diagnosis not present

## 2016-06-19 DIAGNOSIS — O0993 Supervision of high risk pregnancy, unspecified, third trimester: Secondary | ICD-10-CM

## 2016-06-19 DIAGNOSIS — O365933 Maternal care for other known or suspected poor fetal growth, third trimester, fetus 3: Secondary | ICD-10-CM

## 2016-06-19 NOTE — Progress Notes (Signed)
Subjective:    [name] is a 20 y.o. female being seen today for her obstetrical visit. She is at 7425w5d gestation. Patient reports no complaints. Fetal movement: normal.  Problem List Items Addressed This Visit      Other   Supervision of high-risk pregnancy    Other Visit Diagnoses    SGA (small for gestational age), fetal, affecting care of mother, antepartum, third trimester    -  Primary     Patient Active Problem List   Diagnosis Date Noted  . SGA (small for gestational age), fetal, affecting care of mother, antepartum 04/10/2016  . Supervision of high-risk pregnancy 02/14/2016    Objective:    BP 134/88   Pulse 92   Temp 97.6 F (36.4 C)   Wt 252 lb 3.2 oz (114.4 kg)   LMP 09/20/2015  FHT: 150 BPM  Uterine Size: 37 cm and size equals dates  Presentations: cephalic  Pelvic Exam: deferred  NST: + accels, no decels, moderate variability, Cat. 1 tracing. No contractions on toco.   Assessment:    Pregnancy @ 4925w5d weeks   Reactive NST   SGA status  Plan:   Plans for delivery: Vaginal anticipated; labs reviewed; problem list updated Counseling: Consent signed. Infant feeding: plans to breastfeed. Cigarette smoking: never smoked. L&D discussion: symptoms of labor, discussed when to call, discussed what number to call, anesthetic/analgesic options reviewed and delivering clinician:  plans no preference. Postpartum supports and preparation: circumcision discussed and contraception plans discussed. GBS Negative  Follow up in 1 Week.

## 2016-06-19 NOTE — Patient Instructions (Addendum)
Third Trimester of Pregnancy The third trimester is from week 29 through week 42, months 7 through 9. The third trimester is a time when the fetus is growing rapidly. At the end of the ninth month, the fetus is about 20 inches in length and weighs 6-10 pounds.  BODY CHANGES Your body goes through many changes during pregnancy. The changes vary from woman to woman.   Your weight will continue to increase. You can expect to gain 25-35 pounds (11-16 kg) by the end of the pregnancy.  You may begin to get stretch marks on your hips, abdomen, and breasts.  You may urinate more often because the fetus is moving lower into your pelvis and pressing on your bladder.  You may develop or continue to have heartburn as a result of your pregnancy.  You may develop constipation because certain hormones are causing the muscles that push waste through your intestines to slow down.  You may develop hemorrhoids or swollen, bulging veins (varicose veins).  You may have pelvic pain because of the weight gain and pregnancy hormones relaxing your joints between the bones in your pelvis. Backaches may result from overexertion of the muscles supporting your posture.  You may have changes in your hair. These can include thickening of your hair, rapid growth, and changes in texture. Some women also have hair loss during or after pregnancy, or hair that feels dry or thin. Your hair will most likely return to normal after your baby is born.  Your breasts will continue to grow and be tender. A yellow discharge may leak from your breasts called colostrum.  Your belly button may stick out.  You may feel short of breath because of your expanding uterus.  You may notice the fetus "dropping," or moving lower in your abdomen.  You may have a bloody mucus discharge. This usually occurs a few days to a week before labor begins.  Your cervix becomes thin and soft (effaced) near your due date. WHAT TO EXPECT AT YOUR PRENATAL  EXAMS  You will have prenatal exams every 2 weeks until week 36. Then, you will have weekly prenatal exams. During a routine prenatal visit:  You will be weighed to make sure you and the fetus are growing normally.  Your blood pressure is taken.  Your abdomen will be measured to track your baby's growth.  The fetal heartbeat will be listened to.  Any test results from the previous visit will be discussed.  You may have a cervical check near your due date to see if you have effaced. At around 36 weeks, your caregiver will check your cervix. At the same time, your caregiver will also perform a test on the secretions of the vaginal tissue. This test is to determine if a type of bacteria, Group B streptococcus, is present. Your caregiver will explain this further. Your caregiver may ask you:  What your birth plan is.  How you are feeling.  If you are feeling the baby move.  If you have had any abnormal symptoms, such as leaking fluid, bleeding, severe headaches, or abdominal cramping.  If you are using any tobacco products, including cigarettes, chewing tobacco, and electronic cigarettes.  If you have any questions. Other tests or screenings that may be performed during your third trimester include:  Blood tests that check for low iron levels (anemia).  Fetal testing to check the health, activity level, and growth of the fetus. Testing is done if you have certain medical conditions or if   there are problems during the pregnancy.  HIV (human immunodeficiency virus) testing. If you are at high risk, you may be screened for HIV during your third trimester of pregnancy. FALSE LABOR You may feel small, irregular contractions that eventually go away. These are called Braxton Hicks contractions, or false labor. Contractions may last for hours, days, or even weeks before true labor sets in. If contractions come at regular intervals, intensify, or become painful, it is best to be seen by your  caregiver.  SIGNS OF LABOR   Menstrual-like cramps.  Contractions that are 5 minutes apart or less.  Contractions that start on the top of the uterus and spread down to the lower abdomen and back.  A sense of increased pelvic pressure or back pain.  A watery or bloody mucus discharge that comes from the vagina. If you have any of these signs before the 37th week of pregnancy, call your caregiver right away. You need to go to the hospital to get checked immediately. HOME CARE INSTRUCTIONS   Avoid all smoking, herbs, alcohol, and unprescribed drugs. These chemicals affect the formation and growth of the baby.  Do not use any tobacco products, including cigarettes, chewing tobacco, and electronic cigarettes. If you need help quitting, ask your health care provider. You may receive counseling support and other resources to help you quit.  Follow your caregiver's instructions regarding medicine use. There are medicines that are either safe or unsafe to take during pregnancy.  Exercise only as directed by your caregiver. Experiencing uterine cramps is a good sign to stop exercising.  Continue to eat regular, healthy meals.  Wear a good support bra for breast tenderness.  Do not use hot tubs, steam rooms, or saunas.  Wear your seat belt at all times when driving.  Avoid raw meat, uncooked cheese, cat litter boxes, and soil used by cats. These carry germs that can cause birth defects in the baby.  Take your prenatal vitamins.  Take 1500-2000 mg of calcium daily starting at the 20th week of pregnancy until you deliver your baby.  Try taking a stool softener (if your caregiver approves) if you develop constipation. Eat more high-fiber foods, such as fresh vegetables or fruit and whole grains. Drink plenty of fluids to keep your urine clear or pale yellow.  Take warm sitz baths to soothe any pain or discomfort caused by hemorrhoids. Use hemorrhoid cream if your caregiver approves.  If  you develop varicose veins, wear support hose. Elevate your feet for 15 minutes, 3-4 times a day. Limit salt in your diet.  Avoid heavy lifting, wear low heal shoes, and practice good posture.  Rest a lot with your legs elevated if you have leg cramps or low back pain.  Visit your dentist if you have not gone during your pregnancy. Use a soft toothbrush to brush your teeth and be gentle when you floss.  A sexual relationship may be continued unless your caregiver directs you otherwise.  Do not travel far distances unless it is absolutely necessary and only with the approval of your caregiver.  Take prenatal classes to understand, practice, and ask questions about the labor and delivery.  Make a trial run to the hospital.  Pack your hospital bag.  Prepare the baby's nursery.  Continue to go to all your prenatal visits as directed by your caregiver. SEEK MEDICAL CARE IF:  You are unsure if you are in labor or if your water has broken.  You have dizziness.  You have  mild pelvic cramps, pelvic pressure, or nagging pain in your abdominal area.  You have persistent nausea, vomiting, or diarrhea.  You have a bad smelling vaginal discharge.  You have pain with urination. SEEK IMMEDIATE MEDICAL CARE IF:   You have a fever.  You are leaking fluid from your vagina.  You have spotting or bleeding from your vagina.  You have severe abdominal cramping or pain.  You have rapid weight loss or gain.  You have shortness of breath with chest pain.  You notice sudden or extreme swelling of your face, hands, ankles, feet, or legs.  You have not felt your baby move in over an hour.  You have severe headaches that do not go away with medicine.  You have vision changes.   This information is not intended to replace advice given to you by your health care provider. Make sure you discuss any questions you have with your health care provider.   Document Released: 08/20/2001 Document  Revised: 09/16/2014 Document Reviewed: 10/27/2012 Elsevier Interactive Patient Education 2016 [place] Before your baby arrives it is important to:  Have all of the supplies that you will need to care for your baby.  Know where to go if there is an emergency.  Discuss the baby's arrival with other family members. WHAT SUPPLIES WILL I NEED? It is recommended that you have the following supplies: Large Items  Crib.  Crib mattress.  Rear-facing infant car seat. If possible, have a trained professional check to make sure that it is installed correctly. Feeding  6-8 bottles that are 4-5 oz in size.  6-8 nipples.  Bottle brush.  Sterilizer, or a large pan or kettle with a lid.  A way to boil and cool water.  If you will be breastfeeding:  Breast pump.  Nipple cream.  Nursing bra.  Breast pads.  Breast shields.  If you will be formula feeding:  Formula.  Measuring cups.  Measuring spoons. Bathing  Mild baby soap and baby shampoo.  Petroleum jelly.  Soft cloth towel and washcloth.  Hooded towel.  Cotton balls.  Bath basin. Other Supplies  Rectal thermometer.  Bulb syringe.  Baby wipes or washcloths for diaper changes.  Diaper bag.  Changing pad.  Clothing, including one-piece outfits and pajamas.  Baby nail clippers.  Receiving blankets.  Mattress pad and sheets for the crib.  Night-light for the baby's room.  Baby monitor.  2 or 3 pacifiers.  Either 24-36 cloth diapers and waterproof diaper covers or a box of disposable diapers. You may need to use as many as 10-12 diapers per day. HOW DO I PREPARE FOR AN EMERGENCY? Prepare for an emergency by:  Knowing how to get to the nearest hospital.  Listing the phone numbers of your baby's health care providers near your home phone and in your cell phone. HOW DO I PREPARE MY FAMILY?  Decide how to handle visitors.  If you have other children:  Talk  with them about the baby coming home. Ask them how they feel about it.  Read a book together about being a new big brother or sister.  Find ways to let them help you prepare for the new baby.  Have someone ready to care for them while you are in the hospital.   This information is not intended to replace advice given to you by your health care provider. Make sure you discuss any questions you have with your health care provider.  Document Released: 08/08/2008 Document Revised: 01/10/2015 Document Reviewed: 08/03/2014 Elsevier Interactive Patient Education 2016 [employer]  Vaginal Delivery During delivery, your health care provider will help you give birth to your baby. During a vaginal delivery, you will work to push the baby out of your vagina. However, before you can push your baby out, a few things need to happen. The opening of your uterus (cervix) has to soften, thin out, and open up (dilate) all the way to 10 cm. Also, your baby has to move down from the uterus into your vagina.  SIGNS OF LABOR  Your health care provider will first need to make sure you are in labor. Signs of labor include:   Passing what is called the mucous plug before labor begins. This is a small amount of blood-stained mucus.  Having regular, painful uterine contractions.   The time between contractions gets shorter.   The discomfort and pain gradually get more intense.  Contraction pains get worse when walking and do not go away when resting.   Your cervix becomes thinner (effacement) and dilates. BEFORE THE DELIVERY Once you are in labor and admitted into the hospital or care center, your health care provider may do the following:   Perform a complete physical exam.  Review any complications related to pregnancy or labor.  Check your blood pressure, pulse, temperature, and heart rate (vital signs).   Determine if, and when, the rupture of amniotic membranes occurred.  Do a vaginal exam  (using a sterile glove and lubricant) to determine:   The position (presentation) of the baby. Is the baby's head presenting first (vertex) in the birth canal (vagina), or are the feet or buttocks first (breech)?   The level (station) of the baby's head within the birth canal.   The effacement and dilatation of the cervix.   An electronic fetal monitor is usually placed on your abdomen when you first arrive. This is used to monitor your contractions and the baby's heart rate.  When the monitor is on your abdomen (external fetal monitor), it can only pick up the frequency and length of your contractions. It cannot tell the strength of your contractions.  If it becomes necessary for your health care provider to know exactly how strong your contractions are or to see exactly what the baby's heart rate is doing, an internal monitor may be inserted into your vagina and uterus. Your health care provider will discuss the benefits and risks of using an internal monitor and obtain your permission before inserting the device.  Continuous fetal monitoring may be needed if you have an epidural, are receiving certain medicines (such as oxytocin), or have pregnancy or labor complications.  An IV access tube may be placed into a vein in your arm to deliver fluids and medicines if necessary. THREE STAGES OF LABOR AND DELIVERY Normal labor and delivery is divided into three stages. First Stage This stage starts when you begin to contract regularly and your cervix begins to efface and dilate. It ends when your cervix is completely open (fully dilated). The first stage is the longest stage of labor and can last from 3 hours to 15 hours.  Several methods are available to help with labor pain. You and your health care provider will decide which option is best for you. Options include:   Opioid medicines. These are strong pain medicines that you can get through your IV tube or as a shot into your muscle. These  medicines lessen pain but  do not make it go away completely.  Epidural. A medicine is given through a thin tube that is inserted in your back. The medicine numbs the lower part of your body and prevents any pain in that area.  Paracervical pain medicine. This is an injection of an anesthetic on each side of your cervix.   You may request natural childbirth, which does not involve the use of pain medicines or an epidural during labor and delivery. Instead, you will use other things, such as breathing exercises, to help cope with the pain. Second Stage The second stage of labor begins when your cervix is fully dilated at 10 cm. It continues until you push your baby down through the birth canal and the baby is born. This stage can take only minutes or several hours.  The location of your baby's head as it moves through the birth canal is reported as a number called a station. If the baby's head has not started its descent, the station is described as being at minus 3 (-3). When your baby's head is at the zero station, it is at the middle of the birth canal and is engaged in the pelvis. The station of your baby helps indicate the progress of the second stage of labor.  When your baby is born, your health care provider may hold the baby with his or her head lowered to prevent amniotic fluid, mucus, and blood from getting into the baby's lungs. The baby's mouth and nose may be suctioned with a small bulb syringe to remove any additional fluid.  Your health care provider may then place the baby on your stomach. It is important to keep the baby from getting cold. To do this, the health care provider will dry the baby off, place the baby directly on your skin (with no blankets between you and the baby), and cover the baby with warm, dry blankets.   The umbilical cord is cut. Third Stage During the third stage of labor, your health care provider will deliver the placenta (afterbirth) and make sure your  bleeding is under control. The delivery of the placenta usually takes about 5 minutes but can take up to 30 minutes. After the placenta is delivered, a medicine may be given either by IV or injection to help contract the uterus and control bleeding. If you are planning to breastfeed, you can try to do so now. After you deliver the placenta, your uterus should contract and get very firm. If your uterus does not remain firm, your health care provider will massage it. This is important because the contraction of the uterus helps cut off bleeding at the site where the placenta was attached to your uterus. If your uterus does not contract properly and stay firm, you may continue to bleed heavily. If there is a lot of bleeding, medicines may be given to contract the uterus and stop the bleeding.    This information is not intended to replace advice given to you by your health care provider. Make sure you discuss any questions you have with your health care provider.   Document Released: 06/04/2008 Document Revised: 09/16/2014 Document Reviewed: 04/22/2012 Elsevier Interactive Patient Education [employer]

## 2016-06-24 ENCOUNTER — Encounter: Payer: Self-pay | Admitting: Certified Nurse Midwife

## 2016-07-03 ENCOUNTER — Ambulatory Visit (INDEPENDENT_AMBULATORY_CARE_PROVIDER_SITE_OTHER): Payer: Medicaid Other | Admitting: Obstetrics and Gynecology

## 2016-07-03 VITALS — BP 133/87 | HR 99 | Temp 98.0°F | Wt 257.0 lb

## 2016-07-03 DIAGNOSIS — O0993 Supervision of high risk pregnancy, unspecified, third trimester: Secondary | ICD-10-CM

## 2016-07-03 DIAGNOSIS — O365930 Maternal care for other known or suspected poor fetal growth, third trimester, not applicable or unspecified: Secondary | ICD-10-CM | POA: Diagnosis not present

## 2016-07-03 DIAGNOSIS — O365990 Maternal care for other known or suspected poor fetal growth, unspecified trimester, not applicable or unspecified: Secondary | ICD-10-CM

## 2016-07-03 NOTE — Patient Instructions (Signed)
Vaginal Delivery °During delivery, your health care provider will help you give birth to your baby. During a vaginal delivery, you will work to push the baby out of your vagina. However, before you can push your baby out, a few things need to happen. The opening of your uterus (cervix) has to soften, thin out, and open up (dilate) all the way to 10 cm. Also, your baby has to move down from the uterus into your vagina.  °SIGNS OF LABOR  °Your health care provider will first need to make sure you are in labor. Signs of labor include:  °· Passing what is called the mucous plug before labor begins. This is a small amount of blood-stained mucus. °· Having regular, painful uterine contractions.   °· The time between contractions gets shorter.   °· The discomfort and pain gradually get more intense. °· Contraction pains get worse when walking and do not go away when resting.   °· Your cervix becomes thinner (effacement) and dilates. °BEFORE THE DELIVERY °Once you are in labor and admitted into the hospital or care center, your health care provider may do the following:  °· Perform a complete physical exam. °· Review any complications related to pregnancy or labor.  °· Check your blood pressure, pulse, temperature, and heart rate (vital signs).   °· Determine if, and when, the rupture of amniotic membranes occurred. °· Do a vaginal exam (using a sterile glove and lubricant) to determine:   °¨ The position (presentation) of the baby. Is the baby's head presenting first (vertex) in the birth canal (vagina), or are the feet or buttocks first (breech)?   °¨ The level (station) of the baby's head within the birth canal.   °¨ The effacement and dilatation of the cervix.   °· An electronic fetal monitor is usually placed on your abdomen when you first arrive. This is used to monitor your contractions and the baby's heart rate. °¨ When the monitor is on your abdomen (external fetal monitor), it can only pick up the frequency and  length of your contractions. It cannot tell the strength of your contractions. °¨ If it becomes necessary for your health care provider to know exactly how strong your contractions are or to see exactly what the baby's heart rate is doing, an internal monitor may be inserted into your vagina and uterus. Your health care provider will discuss the benefits and risks of using an internal monitor and obtain your permission before inserting the device. °¨ Continuous fetal monitoring may be needed if you have an epidural, are receiving certain medicines (such as oxytocin), or have pregnancy or labor complications. °· An IV access tube may be placed into a vein in your arm to deliver fluids and medicines if necessary. °THREE STAGES OF LABOR AND DELIVERY °Normal labor and delivery is divided into three stages. °First Stage °This stage starts when you begin to contract regularly and your cervix begins to efface and dilate. It ends when your cervix is completely open (fully dilated). The first stage is the longest stage of labor and can last from 3 hours to 15 hours.  °Several methods are available to help with labor pain. You and your health care provider will decide which option is best for you. Options include:  °· Opioid medicines. These are strong pain medicines that you can get through your IV tube or as a shot into your muscle. These medicines lessen pain but do not make it go away completely.  °· Epidural. A medicine is given through a thin tube that   is inserted in your back. The medicine numbs the lower part of your body and prevents any pain in that area. °· Paracervical pain medicine. This is an injection of an anesthetic on each side of your cervix.   °· You may request natural childbirth, which does not involve the use of pain medicines or an epidural during labor and delivery. Instead, you will use other things, such as breathing exercises, to help cope with the pain. °Second Stage °The second stage of labor  begins when your cervix is fully dilated at 10 cm. It continues until you push your baby down through the birth canal and the baby is born. This stage can take only minutes or several hours. °· The location of your baby's head as it moves through the birth canal is reported as a number called a station. If the baby's head has not started its descent, the station is described as being at minus 3 (-3). When your baby's head is at the zero station, it is at the middle of the birth canal and is engaged in the pelvis. The station of your baby helps indicate the progress of the second stage of labor. °· When your baby is born, your health care provider may hold the baby with his or her head lowered to prevent amniotic fluid, mucus, and blood from getting into the baby's lungs. The baby's mouth and nose may be suctioned with a small bulb syringe to remove any additional fluid. °· Your health care provider may then place the baby on your stomach. It is important to keep the baby from getting cold. To do this, the health care provider will dry the baby off, place the baby directly on your skin (with no blankets between you and the baby), and cover the baby with warm, dry blankets.   °· The umbilical cord is cut. °Third Stage °During the third stage of labor, your health care provider will deliver the placenta (afterbirth) and make sure your bleeding is under control. The delivery of the placenta usually takes about 5 minutes but can take up to 30 minutes. After the placenta is delivered, a medicine may be given either by IV or injection to help contract the uterus and control bleeding. If you are planning to breastfeed, you can try to do so now. °After you deliver the placenta, your uterus should contract and get very firm. If your uterus does not remain firm, your health care provider will massage it. This is important because the contraction of the uterus helps cut off bleeding at the site where the placenta was attached  to your uterus. If your uterus does not contract properly and stay firm, you may continue to bleed heavily. If there is a lot of bleeding, medicines may be given to contract the uterus and stop the bleeding.  °  °This information is not intended to replace advice given to you by your health care provider. Make sure you discuss any questions you have with your health care provider. °  °Document Released: 06/04/2008 Document Revised: 09/16/2014 Document Reviewed: 04/22/2012 °Elsevier Interactive Patient Education ©2016 [employer] ° °

## 2016-07-03 NOTE — Progress Notes (Signed)
Subjective:  [name] is a 20 y.o. G2P0010 at 3059w5d being seen today for ongoing prenatal care.  She is currently monitored for the following issues for this high-risk pregnancy and has Supervision of high-risk pregnancy and SGA (small for gestational age), fetal, affecting care of mother, antepartum on her problem list.  Patient reports no complaints.  Contractions: Irregular. Vag. Bleeding: None.  Movement: Present. Denies leaking of fluid.   The following portions of the patient's history were reviewed and updated as appropriate: allergies, current medications, past family history, past medical history, past social history, past surgical history and problem list. Problem list updated.  Objective:   Vitals:   07/03/16 1143  BP: 133/87  Pulse: 99  Temp: 98 F (36.7 C)  Weight: 257 lb (116.6 kg)    Fetal Status:     Movement: Present     General:  Alert, oriented and cooperative. Patient is in no acute distress.  Skin: Skin is warm and dry. No rash noted.   Cardiovascular: Normal heart rate noted  Respiratory: Normal respiratory effort, no problems with respiration noted  Abdomen: Soft, gravid, appropriate for gestational age. Pain/Pressure: Present     Pelvic:  Cervical exam deferred        Extremities: Normal range of motion.  Edema: Trace  Mental Status: Normal mood and affect. Normal behavior. Normal judgment and thought content.   Urinalysis:      Assessment and Plan:  Pregnancy: G2P0010 at 5459w5d  1. Poor fetal growth affecting management of mother, antepartum, single or unspecified fetus NST today U/S 06/11/16  EFW 5 # 5 oz 19 %  2. Supervision of high risk pregnancy in third trimester   Term labor symptoms and general obstetric precautions including but not limited to vaginal bleeding, contractions, leaking of fluid and fetal movement were reviewed in detail with the patient. Please refer to After Visit Summary for other counseling recommendations.  Return in  about 1 week (around 07/10/2016).   Hermina StaggersMichael L Kiele Heavrin, MD

## 2016-07-03 NOTE — Progress Notes (Signed)
Patient is in the office for routine appt, reports good fetal movement and 1-2 contractions a day.

## 2016-07-05 ENCOUNTER — Encounter (HOSPITAL_COMMUNITY): Payer: Self-pay

## 2016-07-05 ENCOUNTER — Inpatient Hospital Stay (HOSPITAL_COMMUNITY)
Admission: AD | Admit: 2016-07-05 | Discharge: 2016-07-08 | DRG: 774 | Disposition: A | Discharge status: Home / Self Care | Payer: Medicaid Other | Source: Ambulatory Visit | Attending: Obstetrics and Gynecology | Admitting: Obstetrics and Gynecology

## 2016-07-05 DIAGNOSIS — Z3A39 39 weeks gestation of pregnancy: Secondary | ICD-10-CM | POA: Diagnosis not present

## 2016-07-05 DIAGNOSIS — O99214 Obesity complicating childbirth: Secondary | ICD-10-CM | POA: Diagnosis present

## 2016-07-05 DIAGNOSIS — O9081 Anemia of the puerperium: Secondary | ICD-10-CM | POA: Diagnosis not present

## 2016-07-05 DIAGNOSIS — O133 Gestational [pregnancy-induced] hypertension without significant proteinuria, third trimester: Secondary | ICD-10-CM | POA: Diagnosis present

## 2016-07-05 DIAGNOSIS — O1414 Severe pre-eclampsia complicating childbirth: Principal | ICD-10-CM | POA: Diagnosis present

## 2016-07-05 DIAGNOSIS — E669 Obesity, unspecified: Secondary | ICD-10-CM | POA: Diagnosis present

## 2016-07-05 DIAGNOSIS — Z6841 Body Mass Index (BMI) 40.0 and over, adult: Secondary | ICD-10-CM | POA: Diagnosis not present

## 2016-07-05 DIAGNOSIS — O0990 Supervision of high risk pregnancy, unspecified, unspecified trimester: Secondary | ICD-10-CM

## 2016-07-05 DIAGNOSIS — O134 Gestational [pregnancy-induced] hypertension without significant proteinuria, complicating childbirth: Secondary | ICD-10-CM | POA: Diagnosis not present

## 2016-07-05 DIAGNOSIS — O365930 Maternal care for other known or suspected poor fetal growth, third trimester, not applicable or unspecified: Secondary | ICD-10-CM | POA: Diagnosis present

## 2016-07-05 DIAGNOSIS — Z3A40 40 weeks gestation of pregnancy: Secondary | ICD-10-CM | POA: Diagnosis not present

## 2016-07-05 LAB — COMPREHENSIVE METABOLIC PANEL
ALK PHOS: 140 U/L — AB (ref 38–126)
ALT: 30 U/L (ref 14–54)
AST: 25 U/L (ref 15–41)
Albumin: 3.3 g/dL — ABNORMAL LOW (ref 3.5–5.0)
Anion gap: 6 (ref 5–15)
BILIRUBIN TOTAL: 0.6 mg/dL (ref 0.3–1.2)
BUN: 11 mg/dL (ref 6–20)
CALCIUM: 9.3 mg/dL (ref 8.9–10.3)
CO2: 23 mmol/L (ref 22–32)
Chloride: 110 mmol/L (ref 101–111)
Creatinine, Ser: 0.75 mg/dL (ref 0.44–1.00)
GFR calc Af Amer: >60 mL/min (ref >60)
GLUCOSE: 97 mg/dL (ref 65–99)
POTASSIUM: 3.6 mmol/L (ref 3.5–5.1)
Sodium: 139 mmol/L (ref 135–145)
TOTAL PROTEIN: 6.4 g/dL — AB (ref 6.5–8.1)

## 2016-07-05 LAB — PROTEIN / CREATININE RATIO, URINE
CREATININE, URINE: 257 mg/dL
Protein Creatinine Ratio: 0.33 mg/mg{Cre} — ABNORMAL HIGH (ref 0.00–0.15)
TOTAL PROTEIN, URINE: 85 mg/dL

## 2016-07-05 LAB — CBC
HEMATOCRIT: 34.2 % — AB (ref 36.0–46.0)
Hemoglobin: 11.5 g/dL — ABNORMAL LOW (ref 12.0–15.0)
MCH: 27.3 pg (ref 26.0–34.0)
MCHC: 33.6 g/dL (ref 30.0–36.0)
MCV: 81.2 fL (ref 78.0–100.0)
Platelets: 244 10*3/uL (ref 150–400)
RBC: 4.21 MIL/uL (ref 3.87–5.11)
RDW: 13.7 % (ref 11.5–15.5)
WBC: 9 10*3/uL (ref 4.0–10.5)

## 2016-07-05 LAB — URINALYSIS, ROUTINE W REFLEX MICROSCOPIC
BILIRUBIN URINE: NEGATIVE
GLUCOSE, UA: NEGATIVE mg/dL
HGB URINE DIPSTICK: NEGATIVE
Ketones, ur: NEGATIVE mg/dL
Leukocytes, UA: NEGATIVE
Nitrite: NEGATIVE
PROTEIN: 30 mg/dL — AB
Specific Gravity, Urine: >1.03 — ABNORMAL HIGH (ref 1.005–1.030)
pH: 6 (ref 5.0–8.0)

## 2016-07-05 LAB — URINE MICROSCOPIC-ADD ON

## 2016-07-05 LAB — AMNISURE RUPTURE OF MEMBRANE (ROM) NOT AT ARMC: AMNISURE: NEGATIVE

## 2016-07-05 MED ORDER — ONDANSETRON HCL 4 MG/2ML IJ SOLN: 4.0000 mg | Freq: Four times a day (QID) | INTRAMUSCULAR | Status: DC | PRN

## 2016-07-05 MED ORDER — LIDOCAINE HCL (PF) 1 % IJ SOLN
30.0000 mL | INTRAMUSCULAR | Status: DC | PRN
  Filled 2016-07-05 (×2): qty 30

## 2016-07-05 MED ORDER — ACETAMINOPHEN 325 MG PO TABS: 650.0000 mg | ORAL_TABLET | ORAL | Status: DC | PRN

## 2016-07-05 MED ORDER — OXYCODONE-ACETAMINOPHEN 5-325 MG PO TABS: 1.0000 | ORAL_TABLET | ORAL | Status: DC | PRN

## 2016-07-05 MED ORDER — LACTATED RINGERS IV SOLN: 500.0000 mL | INTRAVENOUS | Status: DC | PRN

## 2016-07-05 MED ORDER — OXYTOCIN BOLUS FROM INFUSION: 500.0000 mL | Freq: Once | INTRAVENOUS | Status: DC

## 2016-07-05 MED ORDER — LACTATED RINGERS IV SOLN
INTRAVENOUS | Status: DC
  Administered 2016-07-05 – 2016-07-07 (×2): via INTRAVENOUS

## 2016-07-05 MED ORDER — OXYTOCIN 40 UNITS IN LACTATED RINGERS INFUSION - SIMPLE MED: 2.5000 [IU]/h | INTRAVENOUS | Status: DC

## 2016-07-05 MED ORDER — SOD CITRATE-CITRIC ACID 500-334 MG/5ML PO SOLN: 30.0000 mL | ORAL | Status: DC | PRN

## 2016-07-05 MED ORDER — MISOPROSTOL 50MCG HALF TABLET
50.0000 ug | ORAL_TABLET | ORAL | Status: DC | PRN
  Filled 2016-07-05: qty 1

## 2016-07-05 MED ORDER — ZOLPIDEM TARTRATE 5 MG PO TABS
5.0000 mg | ORAL_TABLET | Freq: Every evening | ORAL | Status: DC | PRN
  Administered 2016-07-06: 5 mg via ORAL
  Filled 2016-07-05: qty 1

## 2016-07-05 MED ORDER — TERBUTALINE SULFATE 1 MG/ML IJ SOLN: 0.2500 mg | Freq: Once | INTRAMUSCULAR | Status: DC | PRN

## 2016-07-05 MED ORDER — OXYCODONE-ACETAMINOPHEN 5-325 MG PO TABS: 2.0000 | ORAL_TABLET | ORAL | Status: DC | PRN

## 2016-07-05 MED ORDER — FENTANYL CITRATE (PF) 100 MCG/2ML IJ SOLN
100.0000 ug | INTRAMUSCULAR | Status: DC | PRN
  Administered 2016-07-06: 100 ug via INTRAVENOUS
  Administered 2016-07-06: 50 ug via INTRAVENOUS
  Administered 2016-07-06 (×2): 100 ug via INTRAVENOUS
  Administered 2016-07-06: 50 ug via INTRAVENOUS
  Administered 2016-07-06 (×5): 100 ug via INTRAVENOUS
  Filled 2016-07-05 (×10): qty 2

## 2016-07-05 MED ORDER — MISOPROSTOL 25 MCG QUARTER TABLET
25.0000 ug | ORAL_TABLET | ORAL | Status: DC | PRN
  Administered 2016-07-05: 25 ug via VAGINAL
  Filled 2016-07-05: qty 0.25

## 2016-07-05 NOTE — Progress Notes (Signed)
Pt to BS from Triage via w/c for further eval of SROM and labor.

## 2016-07-05 NOTE — MAU Note (Signed)
Leaking fld for an hour. Clear fld. Contractions off and on all day. Bloody show. Was 2cm last sve.

## 2016-07-05 NOTE — H&P (Signed)
[name] is a 20 y.o. female G2P0010 @ 40.0wks by 12wk scan presenting for eval of possibly leaking fluid. Reports irreg ctx, no bldg. Denies H/A, N/V or RUQ pain. No visual disturbances. Her preg has been followed by [facility] and has been remarkable for 1) tx of care from [place] @ 20wks 2) obesity 3) SGA 19% on 10/3, nl fluid  OB History    Gravida Para Term Preterm AB Living   2 0     1 0   SAB TAB Ectopic Multiple Live Births   1             Past Medical History:  Diagnosis Date  . Medical history non-contributory    Past Surgical History:  Procedure Laterality Date  . DILATION AND CURETTAGE OF UTERUS     Family History: family history includes Diabetes in her paternal grandmother; Hypertension in her paternal grandmother. Social History:  reports that she has never smoked. She has never used smokeless tobacco. She reports that she does not drink alcohol or use drugs.     Maternal Diabetes: No Genetic Screening: Declined Maternal Ultrasounds/Referrals: Normal Fetal Ultrasounds or other Referrals:  None Maternal Substance Abuse:  No Significant Maternal Medications:  None Significant Maternal Lab Results:  Lab values include: Group B Strep negative Other Comments:  None  ROS History  BP: 154/97 Blood pressure (!) 150/89, pulse 98, temperature 98.2 F (36.8 C), temperature source Oral, resp. rate 16, height 5\' 5"  (1.651 m), weight 117 kg (258 lb), last menstrual period 09/20/2015. Exam Physical Exam  Constitutional: She is oriented to person, place, and time. She appears well-developed.  HENT:  Head: Normocephalic.  Neck: Normal range of motion.  Cardiovascular: Normal rate.   Respiratory: Effort normal.  GI:  FHR 130s, +accels, no decels Irreg ctx  Genitourinary: Vagina normal.  Genitourinary Comments: Cx 2/50/-2, sl post  Musculoskeletal: Normal range of motion.  Neurological: She is alert and oriented to person, place, and time.  Skin: Skin is warm and dry.   Psychiatric: She has a normal mood and affect. Her behavior is normal. Thought content normal.    CBC    Component Value Date/Time   WBC 9.0 07/05/2016 2152   RBC 4.21 07/05/2016 2152   HGB 11.5 (L) 07/05/2016 2152   HGB 12.7 11/16/2015   HCT 34.2 (L) 07/05/2016 2152   HCT 38.9 04/10/2016 1100   HCT 38 11/16/2015   PLT 244 07/05/2016 2152   PLT 282 04/10/2016 1100   PLT 297 11/16/2015   MCV 81.2 07/05/2016 2152   MCV 89 04/10/2016 1100   MCH 27.3 07/05/2016 2152   MCHC 33.6 07/05/2016 2152   RDW 13.7 07/05/2016 2152   RDW 13.7 04/10/2016 1100   CMP     Component Value Date/Time   NA 139 07/05/2016 2152   K 3.6 07/05/2016 2152   CL 110 07/05/2016 2152   CO2 23 07/05/2016 2152   GLUCOSE 97 07/05/2016 2152   BUN 11 07/05/2016 2152   CREATININE 0.75 07/05/2016 2152   CALCIUM 9.3 07/05/2016 2152   PROT 6.4 (L) 07/05/2016 2152   ALBUMIN 3.3 (L) 07/05/2016 2152   AST 25 07/05/2016 2152   ALT 30 07/05/2016 2152   ALKPHOS 140 (H) 07/05/2016 2152   BILITOT 0.6 07/05/2016 2152   GFRNONAA >60 07/05/2016 2152   GFRAA >60 07/05/2016 2152   Urine P/C ratio: 0.33  Amnisure: negative  Prenatal labs: ABO, Rh: O/Positive/-- (03/09 0000) Antibody: Negative (03/09 0000) Rubella: Immune (  03/09 0000) RPR: Non Reactive (08/02 1100)  HBsAg: Negative (03/09 0000)  HIV: Non Reactive (08/02 1100)  GBS: Negative (10/05 1445)   Assessment/Plan: IUP@ 40.0wks gHTN- no severe range BPs SGA  Admit to [employer] IOL using cytotec/FB/Pit Will begin Mag if develops severe range BPs Anticipate SVD   Hindy Perrault CNM 07/05/2016, 10:38 PM

## 2016-07-06 ENCOUNTER — Encounter (HOSPITAL_COMMUNITY): Payer: Self-pay | Admitting: *Deleted

## 2016-07-06 DIAGNOSIS — Z3A40 40 weeks gestation of pregnancy: Secondary | ICD-10-CM

## 2016-07-06 DIAGNOSIS — O365930 Maternal care for other known or suspected poor fetal growth, third trimester, not applicable or unspecified: Secondary | ICD-10-CM

## 2016-07-06 DIAGNOSIS — O134 Gestational [pregnancy-induced] hypertension without significant proteinuria, complicating childbirth: Secondary | ICD-10-CM

## 2016-07-06 LAB — CBC
HEMATOCRIT: 36.1 % (ref 36.0–46.0)
HEMOGLOBIN: 12 g/dL (ref 12.0–15.0)
MCH: 27 pg (ref 26.0–34.0)
MCHC: 33.2 g/dL (ref 30.0–36.0)
MCV: 81.1 fL (ref 78.0–100.0)
Platelets: 290 10*3/uL (ref 150–400)
RBC: 4.45 MIL/uL (ref 3.87–5.11)
RDW: 13.7 % (ref 11.5–15.5)
WBC: 26.1 10*3/uL — ABNORMAL HIGH (ref 4.0–10.5)

## 2016-07-06 LAB — DIC (DISSEMINATED INTRAVASCULAR COAGULATION)PANEL
Platelets: 279 10*3/uL (ref 150–400)
Smear Review: NONE SEEN
aPTT: 27 seconds (ref 24–36)

## 2016-07-06 LAB — DIC (DISSEMINATED INTRAVASCULAR COAGULATION) PANEL
D DIMER QUANT: 0.89 ug{FEU}/mL — AB (ref 0.00–0.50)
FIBRINOGEN: 490 mg/dL — AB (ref 210–475)
INR: 0.96
PROTHROMBIN TIME: 12.8 s (ref 11.4–15.2)

## 2016-07-06 LAB — ABO/RH: ABO/RH(D): O POS

## 2016-07-06 LAB — RPR: RPR Ser Ql: NONREACTIVE

## 2016-07-06 MED ORDER — FENTANYL CITRATE (PF) 100 MCG/2ML IJ SOLN
100.0000 ug | Freq: Once | INTRAMUSCULAR | Status: AC
  Administered 2016-07-06: 50 ug via INTRAVENOUS

## 2016-07-06 MED ORDER — DIPHENHYDRAMINE HCL 25 MG PO CAPS
25.0000 mg | ORAL_CAPSULE | Freq: Four times a day (QID) | ORAL | Status: DC | PRN
Start: 2016-07-06 — End: 2016-07-08

## 2016-07-06 MED ORDER — HYDRALAZINE HCL 20 MG/ML IJ SOLN
10.0000 mg | Freq: Once | INTRAMUSCULAR | Status: DC | PRN
  Filled 2016-07-06 (×2): qty 1

## 2016-07-06 MED ORDER — EPHEDRINE 5 MG/ML INJ
10.0000 mg | INTRAVENOUS | Status: DC | PRN
  Filled 2016-07-06: qty 4

## 2016-07-06 MED ORDER — MAGNESIUM SULFATE BOLUS VIA INFUSION
4.0000 g | Freq: Once | INTRAVENOUS | Status: AC
  Administered 2016-07-06: 4 g via INTRAVENOUS
  Filled 2016-07-06: qty 500

## 2016-07-06 MED ORDER — ONDANSETRON HCL 4 MG/2ML IJ SOLN: 4.0000 mg | INTRAMUSCULAR | Status: DC | PRN

## 2016-07-06 MED ORDER — ZOLPIDEM TARTRATE 5 MG PO TABS: 5.0000 mg | ORAL_TABLET | Freq: Every evening | ORAL | Status: DC | PRN

## 2016-07-06 MED ORDER — PHENYLEPHRINE 40 MCG/ML (10ML) SYRINGE FOR IV PUSH (FOR BLOOD PRESSURE SUPPORT)
80.0000 ug | PREFILLED_SYRINGE | INTRAVENOUS | Status: DC | PRN
  Filled 2016-07-06: qty 5

## 2016-07-06 MED ORDER — HYDRALAZINE HCL 20 MG/ML IJ SOLN
10.0000 mg | Freq: Once | INTRAMUSCULAR | Status: AC | PRN
  Administered 2016-07-06: 10 mg via INTRAVENOUS

## 2016-07-06 MED ORDER — LABETALOL HCL 5 MG/ML IV SOLN: 20.0000 mg | INTRAVENOUS | Status: DC | PRN

## 2016-07-06 MED ORDER — FENTANYL 2.5 MCG/ML BUPIVACAINE 1/10 % EPIDURAL INFUSION (WH - ANES): 14.0000 mL/h | INTRAMUSCULAR | Status: DC | PRN

## 2016-07-06 MED ORDER — DIBUCAINE 1 % RE OINT: 1.0000 "application " | TOPICAL_OINTMENT | RECTAL | Status: DC | PRN

## 2016-07-06 MED ORDER — OXYTOCIN 40 UNITS IN LACTATED RINGERS INFUSION - SIMPLE MED
1.0000 m[IU]/min | INTRAVENOUS | Status: DC
  Administered 2016-07-06: 2 m[IU]/min via INTRAVENOUS
  Filled 2016-07-06: qty 1000

## 2016-07-06 MED ORDER — MISOPROSTOL 200 MCG PO TABS
ORAL_TABLET | ORAL | Status: AC
  Administered 2016-07-06: 600 ug via BUCCAL
  Filled 2016-07-06: qty 3

## 2016-07-06 MED ORDER — LACTATED RINGERS IV SOLN: 500.0000 mL | Freq: Once | INTRAVENOUS | Status: DC

## 2016-07-06 MED ORDER — BENZOCAINE-MENTHOL 20-0.5 % EX AERO: 1.0000 "application " | INHALATION_SPRAY | CUTANEOUS | Status: DC | PRN

## 2016-07-06 MED ORDER — LABETALOL HCL 5 MG/ML IV SOLN
20.0000 mg | INTRAVENOUS | Status: AC | PRN
  Administered 2016-07-06: 80 mg via INTRAVENOUS
  Administered 2016-07-06: 40 mg via INTRAVENOUS
  Administered 2016-07-06: 20 mg via INTRAVENOUS
  Filled 2016-07-06: qty 4
  Filled 2016-07-06: qty 16
  Filled 2016-07-06: qty 8

## 2016-07-06 MED ORDER — MAGNESIUM SULFATE 50 % IJ SOLN
2.0000 g/h | INTRAVENOUS | Status: DC
  Administered 2016-07-07: 2 g/h via INTRAVENOUS
  Filled 2016-07-06 (×2): qty 80

## 2016-07-06 MED ORDER — HYDRALAZINE HCL 20 MG/ML IJ SOLN
10.0000 mg | Freq: Once | INTRAMUSCULAR | Status: AC
  Administered 2016-07-06: 10 mg via INTRAVENOUS

## 2016-07-06 MED ORDER — TETANUS-DIPHTH-ACELL PERTUSSIS 5-2.5-18.5 LF-MCG/0.5 IM SUSP
0.5000 mL | Freq: Once | INTRAMUSCULAR | Status: DC
  Filled 2016-07-06: qty 0.5

## 2016-07-06 MED ORDER — ONDANSETRON HCL 4 MG PO TABS: 4.0000 mg | ORAL_TABLET | ORAL | Status: DC | PRN

## 2016-07-06 MED ORDER — SIMETHICONE 80 MG PO CHEW: 80.0000 mg | CHEWABLE_TABLET | ORAL | Status: DC | PRN

## 2016-07-06 MED ORDER — MISOPROSTOL 200 MCG PO TABS
600.0000 ug | ORAL_TABLET | Freq: Once | ORAL | Status: AC
  Administered 2016-07-06: 600 ug via BUCCAL

## 2016-07-06 MED ORDER — MISOPROSTOL 200 MCG PO TABS: 200.0000 ug | ORAL_TABLET | Freq: Once | ORAL | Status: DC

## 2016-07-06 MED ORDER — IBUPROFEN 600 MG PO TABS
600.0000 mg | ORAL_TABLET | Freq: Four times a day (QID) | ORAL | Status: DC
  Administered 2016-07-06 – 2016-07-08 (×7): 600 mg via ORAL
  Filled 2016-07-06 (×7): qty 1

## 2016-07-06 MED ORDER — COCONUT OIL OIL: 1.0000 "application " | TOPICAL_OIL | Status: DC | PRN

## 2016-07-06 MED ORDER — METHYLERGONOVINE MALEATE 0.2 MG/ML IJ SOLN
INTRAMUSCULAR | Status: AC
  Filled 2016-07-06: qty 1

## 2016-07-06 MED ORDER — ACETAMINOPHEN 325 MG PO TABS: 650.0000 mg | ORAL_TABLET | ORAL | Status: DC | PRN

## 2016-07-06 MED ORDER — LACTATED RINGERS IV BOLUS (SEPSIS)
500.0000 mL | Freq: Once | INTRAVENOUS | Status: AC
  Administered 2016-07-06: 500 mL via INTRAVENOUS

## 2016-07-06 MED ORDER — DIPHENHYDRAMINE HCL 50 MG/ML IJ SOLN: 12.5000 mg | INTRAMUSCULAR | Status: DC | PRN

## 2016-07-06 MED ORDER — SENNOSIDES-DOCUSATE SODIUM 8.6-50 MG PO TABS
2.0000 | ORAL_TABLET | ORAL | Status: DC
  Administered 2016-07-06 – 2016-07-07 (×2): 2 via ORAL
  Filled 2016-07-06 (×3): qty 2

## 2016-07-06 MED ORDER — METHYLERGONOVINE MALEATE 0.2 MG/ML IJ SOLN
INTRAMUSCULAR | Status: AC
  Administered 2016-07-06: 0.2 mg via INTRAMUSCULAR
  Filled 2016-07-06: qty 1

## 2016-07-06 MED ORDER — OXYCODONE HCL 5 MG PO TABS: 5.0000 mg | ORAL_TABLET | ORAL | Status: DC | PRN

## 2016-07-06 MED ORDER — FERROUS SULFATE 325 (65 FE) MG PO TABS
325.0000 mg | ORAL_TABLET | Freq: Two times a day (BID) | ORAL | Status: DC
  Administered 2016-07-07 (×2): 325 mg via ORAL
  Filled 2016-07-06 (×2): qty 1

## 2016-07-06 MED ORDER — MAGNESIUM SULFATE 50 % IJ SOLN
2.0000 g/h | INTRAVENOUS | Status: DC
  Administered 2016-07-06: 2 g/h via INTRAVENOUS
  Filled 2016-07-06: qty 80

## 2016-07-06 MED ORDER — WITCH HAZEL-GLYCERIN EX PADS: 1.0000 "application " | MEDICATED_PAD | CUTANEOUS | Status: DC | PRN

## 2016-07-06 MED ORDER — PRENATAL MULTIVITAMIN CH
1.0000 | ORAL_TABLET | Freq: Every day | ORAL | Status: DC
  Administered 2016-07-07 – 2016-07-08 (×2): 1 via ORAL
  Filled 2016-07-06 (×2): qty 1

## 2016-07-06 NOTE — Progress Notes (Signed)
Patient seen and doing well this morning. Nursing reports elevated BP to >180 or >110. Have required 2 doses of labetolol. Will start magnesium at this time. Continue to monitor BP. Category 1 tracing.

## 2016-07-06 NOTE — Progress Notes (Signed)
[facility]  I was dismissed by [facility] RN as patient did not require RT at this time.

## 2016-07-06 NOTE — Progress Notes (Signed)
Patient ID: [name], female   DOB: Jan 23, 1996, 20 y.o.   MRN: [id number]  Has rec'd Fentanyl x 4 doses for mod relief; foley out at approx 0300   BP 148/84, other VSS FHR 120s, +accels, no decels Ctx q 2-5 mins, spont Cx deferred  IUP@term  gHTN Cx favorable now  Will begin Pitocin Watch BPs  SHAW, KIMBERLY CNM 07/06/2016 8:55 AM

## 2016-07-06 NOTE — Anesthesia Pain Management Evaluation Note (Signed)
  CRNA Pain Management Visit Note  Patient: [name], 20 y.o., female  "Hello I am a [facility]. We have an anesthesia team available at all times to provide care throughout the hospital, including epidural management and anesthesia for C-section. I don't know your plan for the delivery whether it a natural birth, water birth, IV sedation, nitrous supplementation, doula or epidural, but we want to meet your pain goals."   1.Was your pain managed to your expectations on prior hospitalizations?   No prior hospitalizations  2.What is your expectation for pain management during this hospitalization?     Labor support without medications  3.How can we help you reach that goal? Natural childbirth plan  Record the patient's initial score and the patient's pain goal.   Pain: 7  Pain Goal: 10 [facility] wants you to be able to say your pain was always managed very well.  [name] 07/06/2016

## 2016-07-07 LAB — COMPREHENSIVE METABOLIC PANEL
ALBUMIN: 2.3 g/dL — AB (ref 3.5–5.0)
ALT: 22 U/L (ref 14–54)
ANION GAP: 6 (ref 5–15)
AST: 25 U/L (ref 15–41)
Alkaline Phosphatase: 98 U/L (ref 38–126)
BILIRUBIN TOTAL: 0.3 mg/dL (ref 0.3–1.2)
BUN: 9 mg/dL (ref 6–20)
CHLORIDE: 106 mmol/L (ref 101–111)
CO2: 22 mmol/L (ref 22–32)
Calcium: 7.7 mg/dL — ABNORMAL LOW (ref 8.9–10.3)
Creatinine, Ser: 0.77 mg/dL (ref 0.44–1.00)
GFR calc Af Amer: >60 mL/min (ref >60)
Glucose, Bld: 136 mg/dL — ABNORMAL HIGH (ref 65–99)
POTASSIUM: 3.4 mmol/L — AB (ref 3.5–5.1)
Sodium: 134 mmol/L — ABNORMAL LOW (ref 135–145)
TOTAL PROTEIN: 4.8 g/dL — AB (ref 6.5–8.1)

## 2016-07-07 LAB — CBC
HEMATOCRIT: 24.6 % — AB (ref 36.0–46.0)
Hemoglobin: 8.7 g/dL — ABNORMAL LOW (ref 12.0–15.0)
MCH: 28.2 pg (ref 26.0–34.0)
MCHC: 35.4 g/dL (ref 30.0–36.0)
MCV: 79.9 fL (ref 78.0–100.0)
PLATELETS: 246 10*3/uL (ref 150–400)
RBC: 3.08 MIL/uL — ABNORMAL LOW (ref 3.87–5.11)
RDW: 14.1 % (ref 11.5–15.5)
WBC: 21 10*3/uL — AB (ref 4.0–10.5)

## 2016-07-07 NOTE — Progress Notes (Signed)
Post Partum Day 1 Subjective: no complaints and tolerating PO. Pt has not been out of bed since delivery. She denies HA or SOB or visual changes.  Her vaginal packing is still in.    Objective: Blood pressure 129/71, pulse 90, temperature 98 F (36.7 C), temperature source Oral, resp. rate 16, height 5\' 5"  (1.651 m), weight 258 lb (117 kg), last menstrual period 09/20/2015, SpO2 100 %, unknown if currently breastfeeding.  I/O last 3 completed shifts: In: 1062.5 [I.V.:1062.5] Out: 5535 [Urine:3050; Blood:2485] Total I/O In: 500 [I.V.:500] Out: 150 [Urine:150]  Physical Exam:  General: alert and appears stated age  CV: RRR Lungs: CTA Lochia: appropriate Uterine Fundus: firm Incision: n/a DVT Evaluation: No evidence of DVT seen on physical exam.   Recent Labs  07/06/16 1725 07/07/16 0524  HGB 12.0 8.7*  HCT 36.1 24.6*    Assessment/Plan: Preeclampsia with severe features on Magnesium sulfate. Doing well clinically Post partum hemorrhage- vaginal packing removed.  Clots evacuated. Pt stable. No active bleeding  Cont Magnesium for 24 hours total Watch BP's OOB to chair. Remove Foley when Magnesium discontinued. POPs for contraception. Pt is breastfeeding   LOS: 2 days   HARRAWAY-SMITH, Jenaye Rickert 07/07/2016, 11:18 AM

## 2016-07-07 NOTE — Progress Notes (Signed)
MD removing vaginal packing and blood clots from vaginal area. Pt then assisted by RN to BR for pericare and foley care.

## 2016-07-08 MED ORDER — POLYSACCHARIDE IRON COMPLEX 150 MG PO CAPS: 150.0000 mg | ORAL_CAPSULE | Freq: Two times a day (BID) | ORAL | 3 refills | Status: DC

## 2016-07-08 MED ORDER — AMLODIPINE BESYLATE 5 MG PO TABS
5.0000 mg | ORAL_TABLET | Freq: Every day | ORAL | Status: DC
  Administered 2016-07-08: 5 mg via ORAL
  Filled 2016-07-08 (×2): qty 1

## 2016-07-08 MED ORDER — POLYSACCHARIDE IRON COMPLEX 150 MG PO CAPS
150.0000 mg | ORAL_CAPSULE | Freq: Two times a day (BID) | ORAL | Status: DC
  Administered 2016-07-08: 150 mg via ORAL
  Filled 2016-07-08: qty 1

## 2016-07-08 MED ORDER — AMLODIPINE BESYLATE 5 MG PO TABS: 5.0000 mg | ORAL_TABLET | Freq: Every day | ORAL | 3 refills | Status: DC

## 2016-07-08 MED ORDER — NORETHINDRONE 0.35 MG PO TABS: 1.0000 | ORAL_TABLET | Freq: Every day | ORAL | 11 refills | Status: DC

## 2016-07-08 MED ORDER — IBUPROFEN 600 MG PO TABS: 600.0000 mg | ORAL_TABLET | Freq: Four times a day (QID) | ORAL | 0 refills | Status: DC

## 2016-07-08 NOTE — Progress Notes (Signed)
Post Partum Day #2, SVD with PPH & severe Pre-eclampsia.   Subjective: up ad lib, voiding and tolerating PO. Is having problems with latching for breast feeding.  Denies HA.   Objective: Blood pressure (!) 146/78, pulse 88, temperature 98.9 F (37.2 C), temperature source Oral, resp. rate 18, height 5\' 5"  (1.651 m), weight 258 lb (117 kg), last menstrual period 09/20/2015, SpO2 100 %, unknown if currently breastfeeding.  Physical Exam:  General: alert, cooperative and no distress Lochia: appropriate Uterine Fundus: firm Incision: none DVT Evaluation: No evidence of DVT seen on physical exam. No cords or calf tenderness. No significant calf/ankle edema.   Recent Labs  07/06/16 1725 07/07/16 0524  HGB 12.0 8.7*  HCT 36.1 24.6*    Assessment/Plan: Plan for discharge tomorrow, Breastfeeding, Lactation consult and Contraception POP Anemia: iron started.  Norvasc 5 mg started.     LOS: 3 days   [name], CNM 07/08/2016, 9:00 AM

## 2016-07-08 NOTE — Lactation Note (Signed)
This note was copied from a baby's chart. Lactation Consultation Note Mom called for latch assistance. Mom holding baby in cradle position dressed, swaddled and mom dressed. Encouraged mom to do STS. Baby diaper changed w/void. Assisted in football position STS. Encouraged to roll and stimulate nipple, taught "C" hold. Latched baby.  Baby has THICK upper labial frenulum, upper lip and chin needs flanged out after latched. Mom has pendulum "V" shaped breast. Rt. Breast a size larger than Lt. Discussed w/mom may want to lift Rt. Breast up w/dry wash cloth. Lt. Breast isn't as long. Hand expressed colostrum from very compressible nipples.  Discussed positioning and using support. Discussed proper body alignment. Mom stated latch felt much better.  Patient Name: Boy Minda MeoZahirah Palladino ZOXWR'UToday's Date: 07/08/2016 Reason for consult: Follow-up assessment   Maternal Data Has patient been taught Hand Expression?: Yes Does the patient have breastfeeding experience prior to this delivery?: No  Feeding Feeding Type: Breast Fed Length of feed: 20 min  LATCH Score/Interventions Latch: Repeated attempts needed to sustain latch, nipple held in mouth throughout feeding, stimulation needed to elicit sucking reflex. Intervention(s): Adjust position;Assist with latch;Breast massage;Breast compression  Audible Swallowing: A few with stimulation Intervention(s): Skin to skin;Hand expression;Alternate breast massage  Type of Nipple: Everted at rest and after stimulation  Comfort (Breast/Nipple): Filling, red/small blisters or bruises, mild/mod discomfort  Problem noted: Mild/Moderate discomfort Interventions (Mild/moderate discomfort): Hand massage;Hand expression  Hold (Positioning): Assistance needed to correctly position infant at breast and maintain latch. Intervention(s): Breastfeeding basics reviewed;Support Pillows;Position options;Skin to skin  LATCH Score: 6  Lactation Tools Discussed/Used WIC  Program: Yes   Consult Status Consult Status: Follow-up Date: 07/08/16 Follow-up type: In-patient    Caelynn Marshman, Diamond NickelLAURA G 07/08/2016, 4:15 AM

## 2016-07-08 NOTE — Discharge Summary (Signed)
OB Discharge Summary     Patient Name: [name] DOB: Sep 06, 1996 MRN: [id number]  Date of admission: 07/05/2016 Delivering MD: Wyvonnia DuskyLAWSON, MARIE D   Date of discharge: 07/08/2016  Admitting diagnosis: 39w water broke, ctx 8-10 min, bleeding Intrauterine pregnancy: 5417w1d     Secondary diagnosis:  Active Problems:   Gestational hypertension, third trimester  Additional problems:  Patient Active Problem List   Diagnosis Date Noted  . Gestational hypertension, third trimester 07/05/2016  . SGA (small for gestational age), fetal, affecting care of mother, antepartum 04/10/2016  . Supervision of high-risk pregnancy 02/14/2016  Obesity Preeclampsia with severe features      Discharge diagnosis: Term Pregnancy Delivered                                                                                                Post partum procedures:magnesium sulfate  Augmentation: Pitocin and Foley Balloon  Complications: None  Hospital course:  Induction of Labor With Vaginal Delivery   20 y.o. yo G2P1011 at 1017w1d was admitted to the hospital 07/05/2016 for induction of labor.  Indication for induction: Gestational hypertension.  Patient had an uncomplicated labor course as follows: Membrane Rupture Time/Date: 5:00 AM ,07/06/2016   Intrapartum Procedures: Episiotomy: None [1]                                         Lacerations:  1st degree [2];Perineal [11]  Patient had delivery of a Viable infant.  Information for the patient's newborn:  Ballard RussellHutton, Boy Alysiana [[id number]][[id number]]  Delivery Method: Vaginal, Spontaneous Delivery (Filed from Delivery Summary)   07/06/2016  Details of delivery can be found in separate delivery note.  Patient had a PP hemorrhage. She had a vaginal packing placed which was removed >24 hours ago with no further bleeding.  Patient is discharged home 07/08/16.   Physical exam Vitals:   07/07/16 2230 07/07/16 2350 07/08/16 0200 07/08/16 0700  BP:  137/81  (!) 146/78   Pulse:  96  88  Resp: 16 16 16 18   Temp:  98.5 F (36.9 C)  98.9 F (37.2 C)  TempSrc:  Oral  Oral  SpO2:      Weight:      Height:       General: alert Lochia: appropriate Uterine Fundus: firm Incision: N/A DVT Evaluation: No evidence of DVT seen on physical exam. Labs: Lab Results  Component Value Date   WBC 21.0 (H) 07/07/2016   HGB 8.7 (L) 07/07/2016   HCT 24.6 (L) 07/07/2016   MCV 79.9 07/07/2016   PLT 246 07/07/2016   CMP Latest Ref Rng & Units 07/07/2016  Glucose 65 - 99 mg/dL 782(N136(H)  BUN 6 - 20 mg/dL 9  Creatinine 5.620.44 - 1.301.00 mg/dL 8.650.77  Sodium 784135 - 696145 mmol/L 134(L)  Potassium 3.5 - 5.1 mmol/L 3.4(L)  Chloride 101 - 111 mmol/L 106  CO2 22 - 32 mmol/L 22  Calcium 8.9 - 10.3 mg/dL 7.7(L)  Total Protein 6.5 - 8.1 g/dL 2.9(B4.8(L)  Total Bilirubin 0.3 - 1.2 mg/dL 0.3  Alkaline Phos 38 - 126 U/L 98  AST 15 - 41 U/L 25  ALT 14 - 54 U/L 22    Discharge instruction: per After Visit Summary and "Baby and Me Booklet".  After visit meds:    Medication List    TAKE these medications   amLODipine 5 MG tablet Commonly known as:  NORVASC Take 1 tablet (5 mg total) by mouth daily.   ibuprofen 600 MG tablet Commonly known as:  ADVIL,MOTRIN Take 1 tablet (600 mg total) by mouth every 6 (six) hours.   iron polysaccharides 150 MG capsule Commonly known as:  NIFEREX Take 1 capsule (150 mg total) by mouth 2 (two) times daily.   norethindrone 0.35 MG tablet Commonly known as:  ORTHO MICRONOR Take 1 tablet (0.35 mg total) by mouth daily.   PRENATE PIXIE 10-0.6-0.4-200 MG Caps Take 1 tablet by mouth daily.       Diet: low salt diet  Activity: Advance as tolerated. Pelvic rest for 6 weeks.   Outpatient follow up:6 weeks Follow up Appt:Future Appointments Date Time Provider Department Center  07/11/2016 2:40 PM Dorathy KinsmanVirginia Smith, CNM [facility] None   Follow up Visit:No Follow-up on file.  Postpartum contraception: Progesterone only pills  Newborn Data: Live  born female  Birth Weight: 6 lb 10.2 oz (3011 g) APGAR: 9, 9  Baby Feeding: Breast Disposition:home with mother   07/08/2016 Willodean RosenthalHARRAWAY-SMITH, Alenah Sarria, MD

## 2016-07-08 NOTE — Discharge Instructions (Signed)

## 2016-07-08 NOTE — Lactation Note (Signed)
This note was copied from a baby's chart. Lactation Consultation Note New mom post MAg. Has flat nipples at rest, stimulate great to everted nipples w/stimulation. Rt. Nipple has small open raw area. Mom c/o tenderness. Comfort gels given. Hand expression taught w/easy colostrum expressed. Encouraged to apply to sore nipples.  Mom standing over baby in crib stated she just got the baby to sleep and just BF. RN assisted in latching. Mom stated BF going well.  [facility] that LC needs to see a latch and review BF information. Mom stated she would. [facility]  Referred to Baby and Me Book in Breastfeeding section Pg. 22-23 for position options and Proper latch demonstration. Discussed position options. Mom encouraged to feed baby 8-12 times/24 hours and with feeding cues. Educated about newborn behavior, STS, I&O, cluster feeding, supply and demand. WH/LC brochure given w/resources, support groups and LC services. Patient Name: [name]: 07/08/2016 Reason for consult: Initial assessment   Maternal Data Has patient been taught Hand Expression?: Yes Does the patient have breastfeeding experience prior to this delivery?: No  Feeding Feeding Type: Breast Fed Length of feed: 15 min  LATCH Score/Interventions       Type of Nipple: Everted at rest and after stimulation  Comfort (Breast/Nipple): Filling, red/small blisters or bruises, mild/mod discomfort  Problem noted: Mild/Moderate discomfort Interventions (Mild/moderate discomfort): Comfort gels;Hand massage;Hand expression  Intervention(s): Breastfeeding basics reviewed;Position options;Skin to skin     Lactation Tools Discussed/Used WIC Program: Yes   Consult Status Consult Status: Follow-up [name]: 07/08/16 Follow-up type: In-patient    Everlena Mackley, Diamond NickelLAURA G 07/08/2016, 1:38 AM

## 2016-07-08 NOTE — Lactation Note (Signed)
This note was copied from a baby's chart. Lactation Consultation Note  Patient Name: Sara Minda MeoZahirah Romig ZHYQM'VToday's Date: 07/08/2016 Reason for consult: Follow-up assessment Follow up with mom prior to discharge.  She states she still feels uncomfortable latching baby but it does not take long for him to latch.  Mom just finished with baby on breast for 30 minutes.  Baby cueing and I offered assist on opposite breast.  Assisted with positioning baby in football hold.  Baby opened wide and latched easily.  Mom using waking techniques and breast massage during feeding.  Baby nursing actively.  Reviewed discharge teaching including engorgement treatment.  Lactation outpatient services and support information reviewed and encouraged.  Maternal Data    Feeding Feeding Type: Breast Fed  LATCH Score/Interventions Latch: Grasps breast easily, tongue down, lips flanged, rhythmical sucking. Intervention(s): Adjust position;Assist with latch;Breast massage;Breast compression  Audible Swallowing: A few with stimulation Intervention(s): Hand expression;Alternate breast massage  Type of Nipple: Everted at rest and after stimulation  Comfort (Breast/Nipple): Soft / non-tender     Hold (Positioning): Assistance needed to correctly position infant at breast and maintain latch. Intervention(s): Breastfeeding basics reviewed;Support Pillows;Position options;Skin to skin  LATCH Score: 8  Lactation Tools Discussed/Used     Consult Status      Huston FoleyMOULDEN, Tricia Pledger S 07/08/2016, 9:47 AM

## 2016-07-09 LAB — TYPE AND SCREEN
ABO/RH(D): O POS
ANTIBODY SCREEN: NEGATIVE
UNIT DIVISION: 0
UNIT DIVISION: 0

## 2016-07-11 ENCOUNTER — Encounter: Payer: Self-pay | Admitting: Advanced Practice Midwife

## 2016-07-17 ENCOUNTER — Ambulatory Visit (HOSPITAL_COMMUNITY): Admit: 2016-07-17 | Payer: Medicaid Other

## 2017-04-21 ENCOUNTER — Ambulatory Visit: Payer: Self-pay | Admitting: Obstetrics and Gynecology

## 2017-07-28 IMAGING — US US MFM FETAL BPP W/O NON-STRESS
1 series · 13 of 28 positions shown · non-contrast
Comparison: none

[Series 1: us mfm fetal bpp w/o non-stress · 31 acquisitions, 13 frames shown]
[im 2/31]
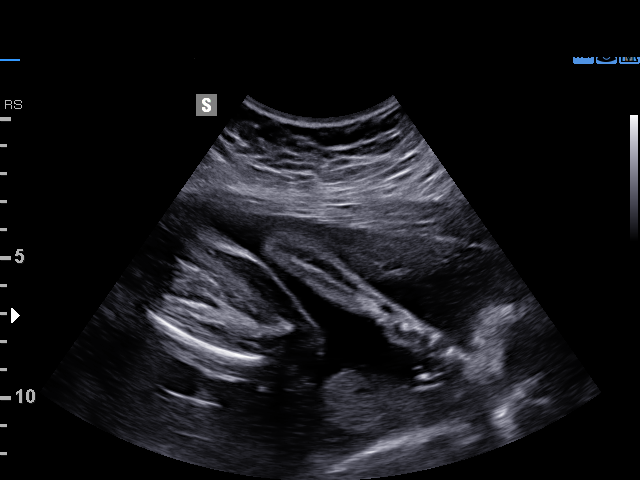
[im 4/31]
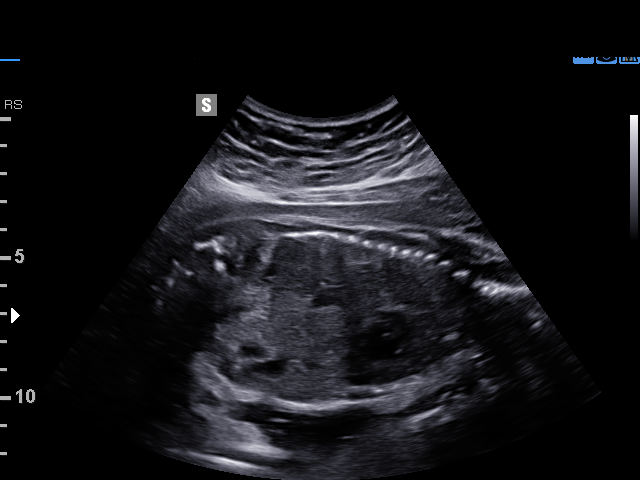
[im 6/31]
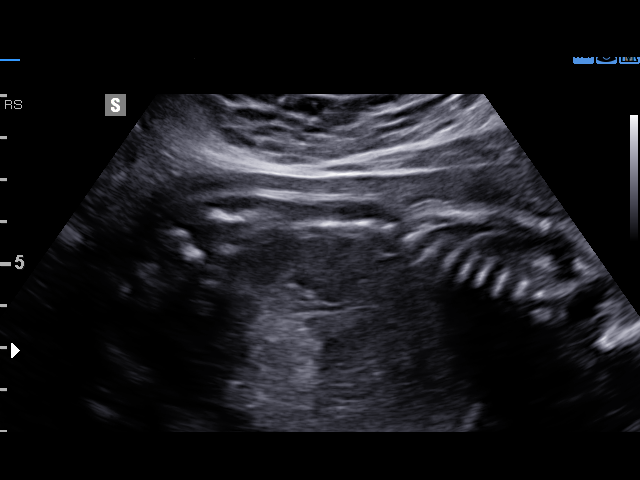
[im 8/31]
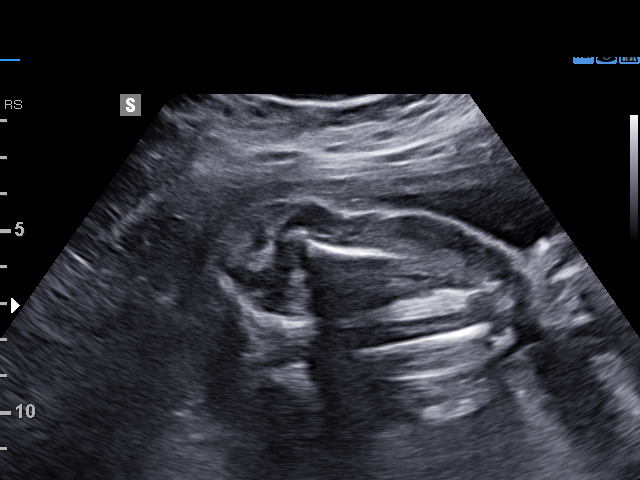
[im 11/31]
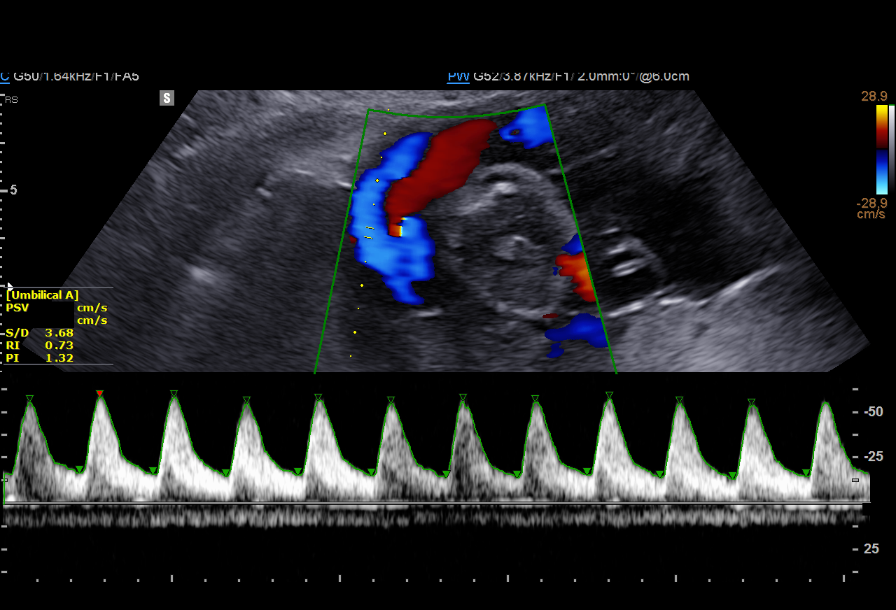
[im 13/31]
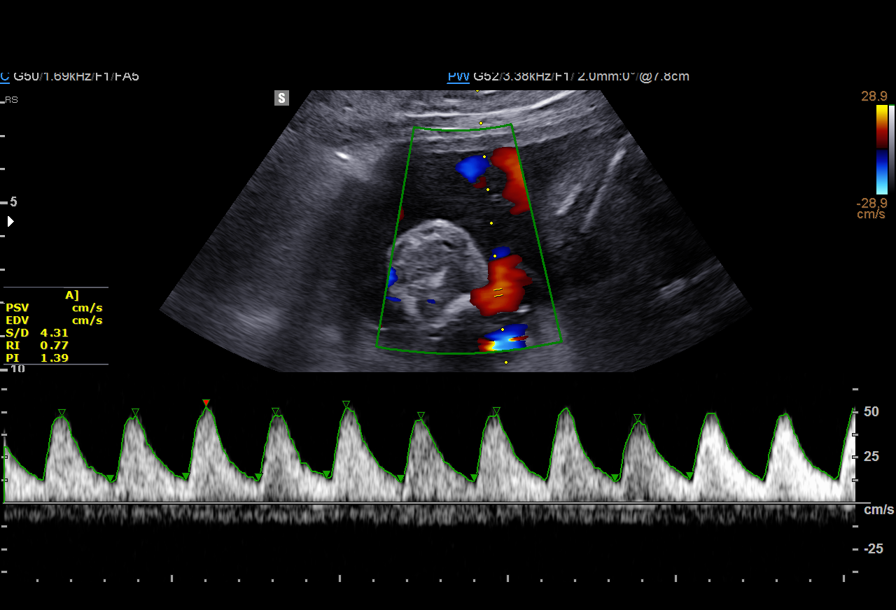
[im 16/31]
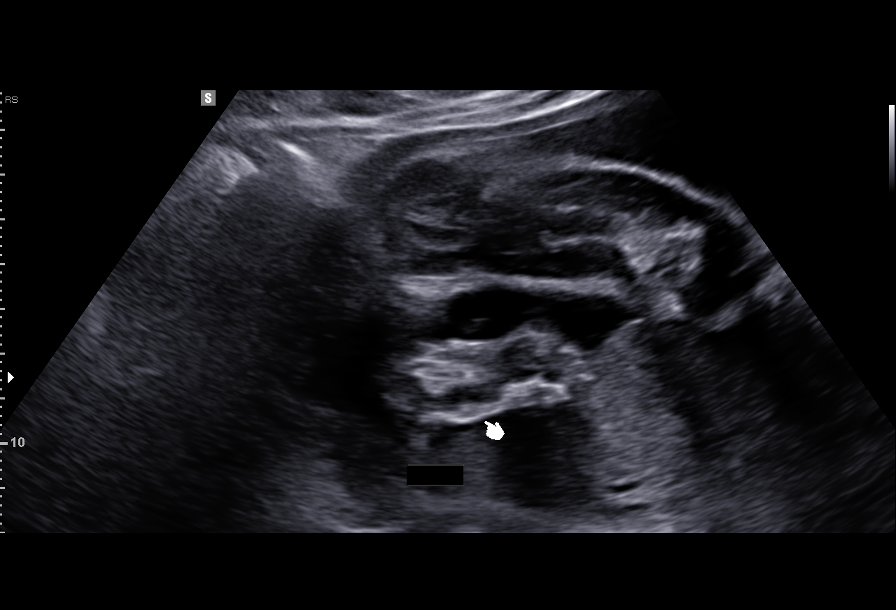
[im 18/31]
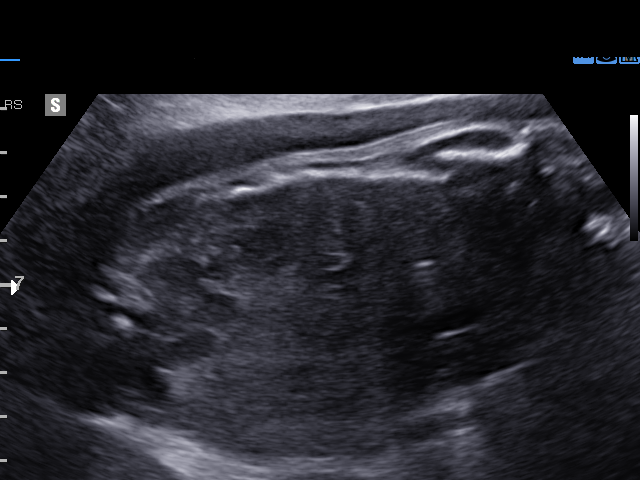
[im 21/31]
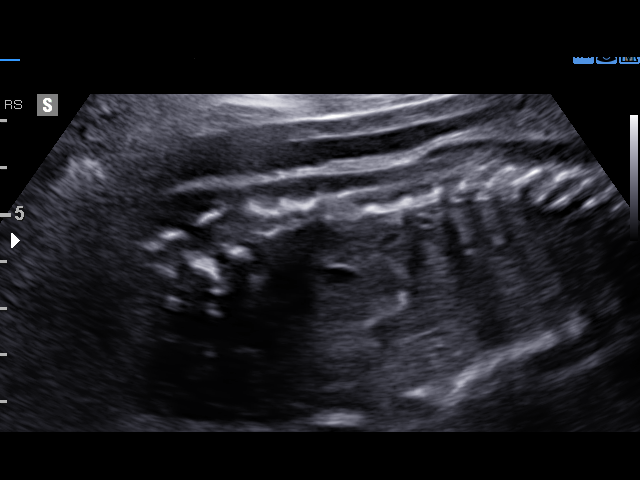
[im 23/31]
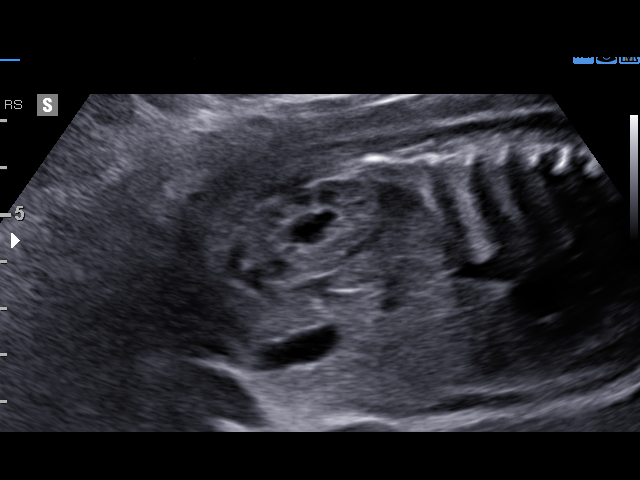
[im 25/31]
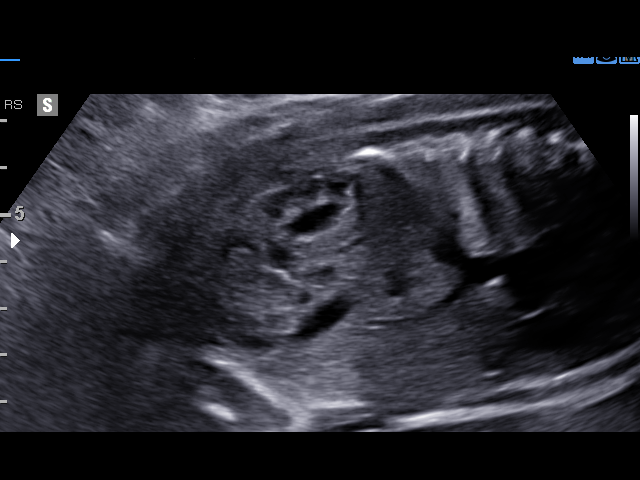
[im 27/31]
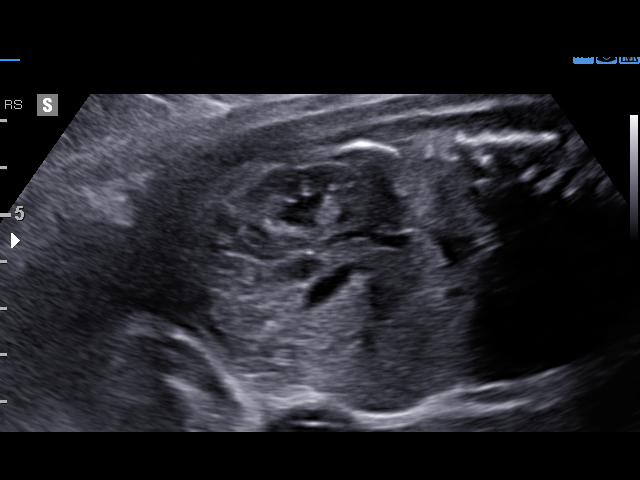
[im 29/31]
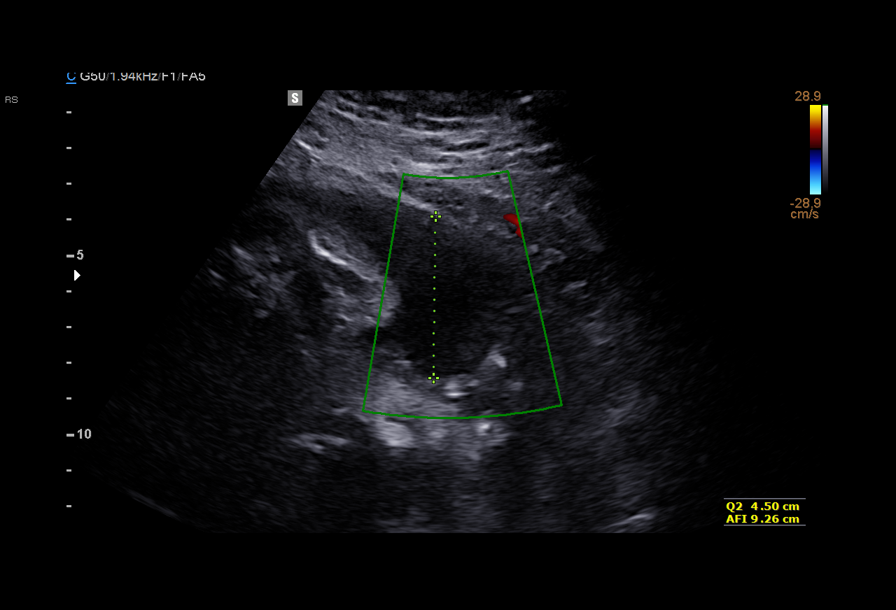

[13 of 28 positions shown; findings below may reference images not displayed]

Road [HOSPITAL]

1  ROBERTO TIGER            [id number]      [id number]     [id number]
2  ROBERTO TIGER            [id number]      [id number]     [id number]
Indications

29 weeks gestation of pregnancy
Maternal care for known or suspected poor
fetal growth, third trimester, not applicable or
unspecified
Obesity complicating pregnancy, third
trimester
OB History

Gravidity:    2         Term:   0        Prem:   0        SAB:   1
TOP:          0       Ectopic:  0        Living: 0
Fetal Evaluation

Num Of Fetuses:     1
Fetal Heart         158
Rate(bpm):
Cardiac Activity:   Observed
Presentation:       Cephalic

Amniotic Fluid
AFI FV:      Subjectively within normal limits

AFI Sum(cm)     %Tile       Largest Pocket(cm)
17.17           64

RUQ(cm)       RLQ(cm)       LUQ(cm)        LLQ(cm)
4.76
Biophysical Evaluation

Amniotic F.V:   Pocket => 2 cm two         F. Tone:        Observed
planes
F. Movement:    Observed                   Score:          [DATE]
F. Breathing:   Observed
Gestational Age

LMP:           31w 1d       Date:   09/18/15                 EDD:   06/24/16
Best:          29w 3d    Det. By:   Early Ultrasound         EDD:   07/06/16
(12/25/15)
Doppler - Fetal Vessels

Umbilical Artery
S/D     %tile     RI              PI              PSV    ADFV    RDFV
(cm/s)
3.63       81   0.72             1.28             52.79     Yes     Yes

Impression

SIUP at 29+3 weeks
Normal amniotic fluid volume
BPP [DATE]
UA dopplers were normal for this GA (81st %tile)
Recommendations

BPP and UA dopplers next week
Growth in 2 weeks

## 2017-08-25 IMAGING — US US MFM OB FOLLOW-UP
1 series · 14 of 28 positions shown · non-contrast
Comparison: none

[Series 1: us mfm ob follow-up · 55 acquisitions, 14 frames shown]
[im 3/55]
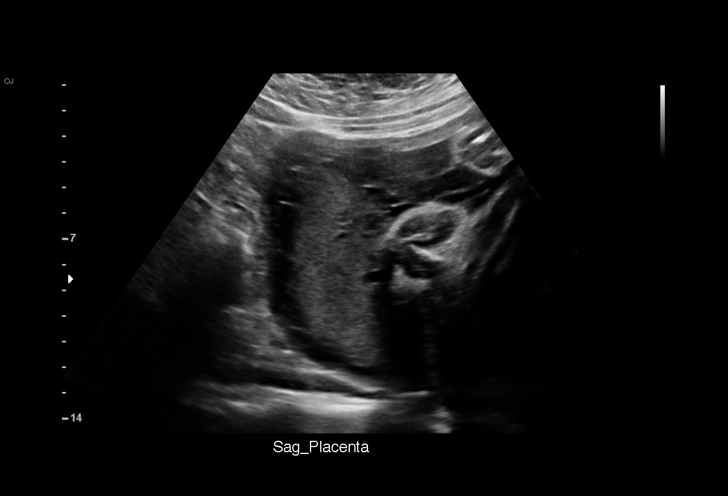
[im 7/55]
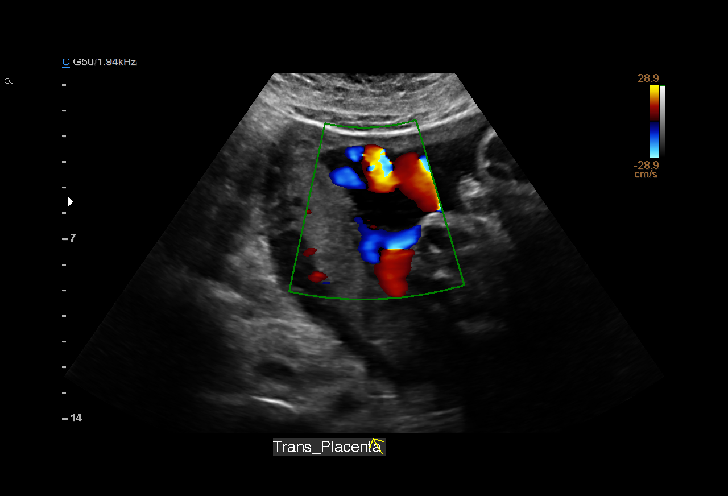
[im 11/55]
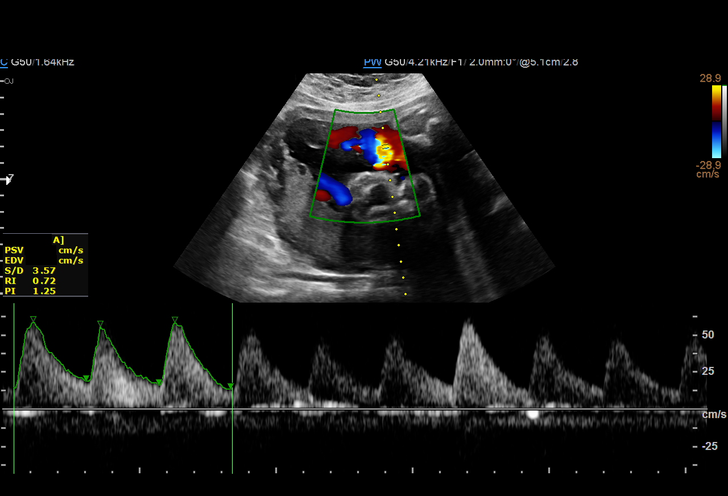
[im 15/55]
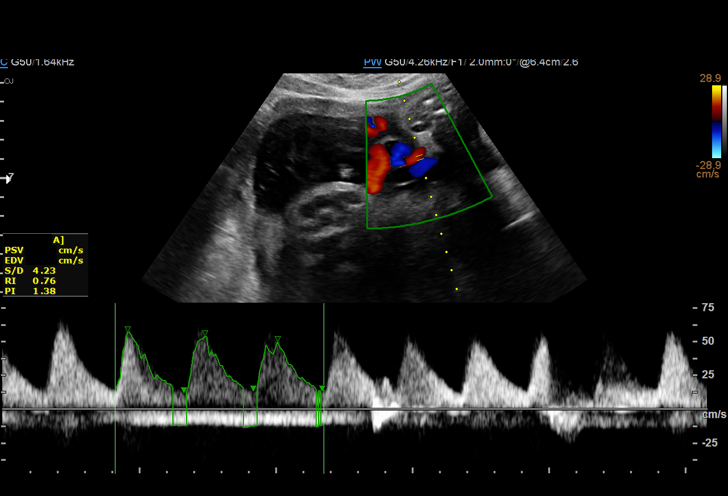
[im 19/55]
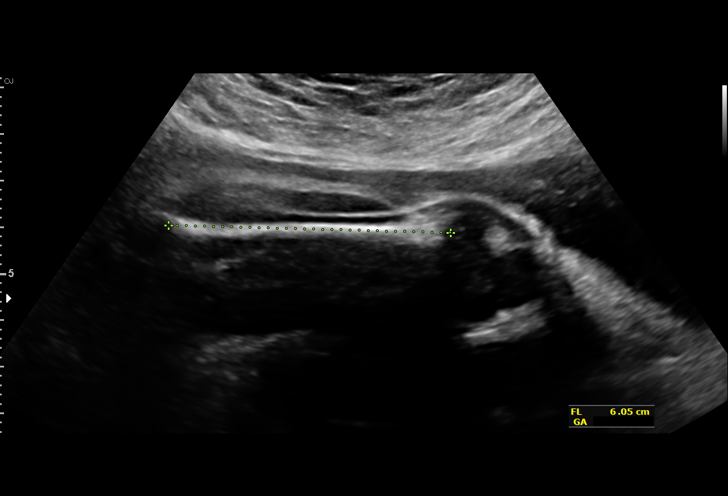
[im 23/55]
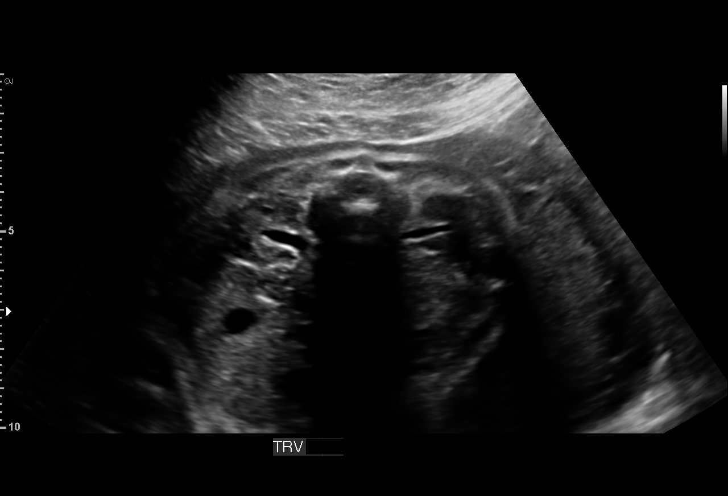
[im 27/55]
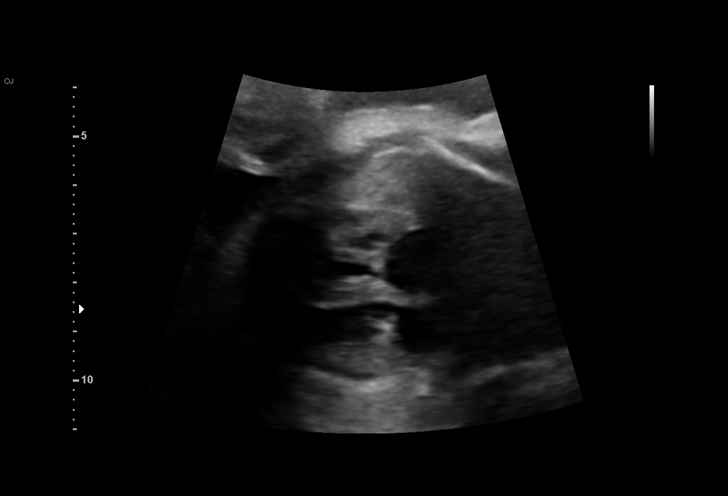
[im 31/55]
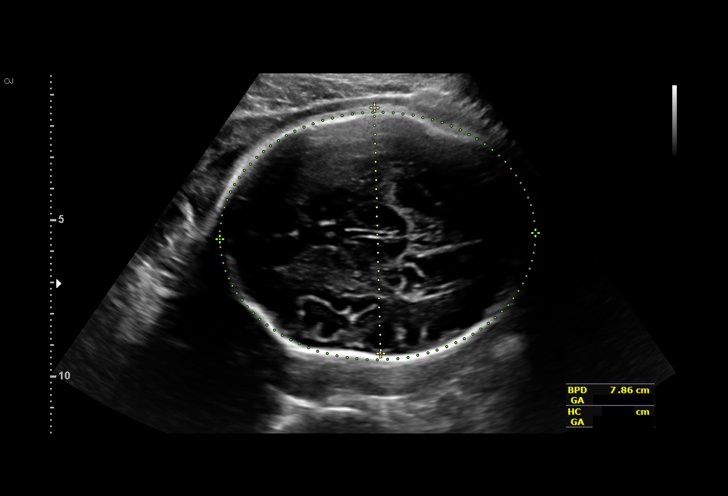
[im 35/55]
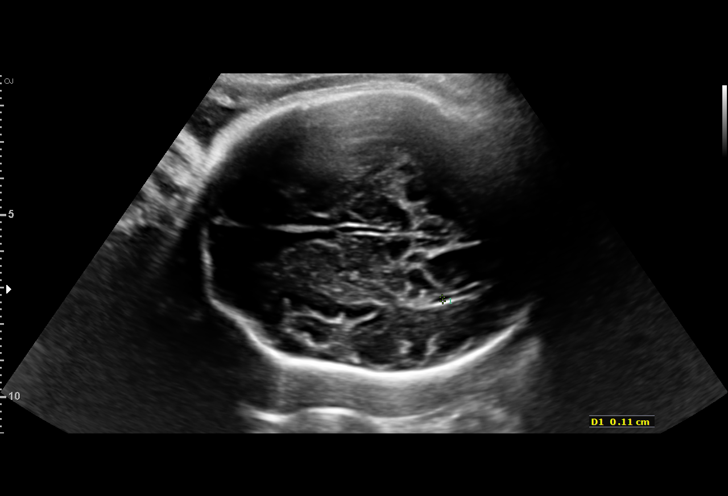
[im 39/55]
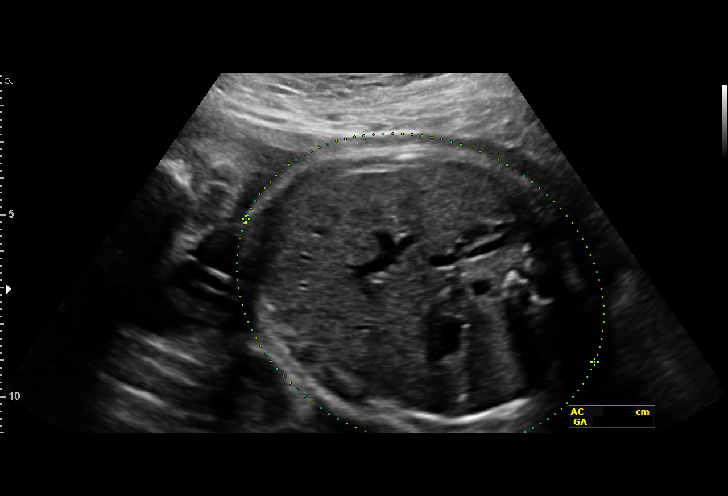
[im 43/55]
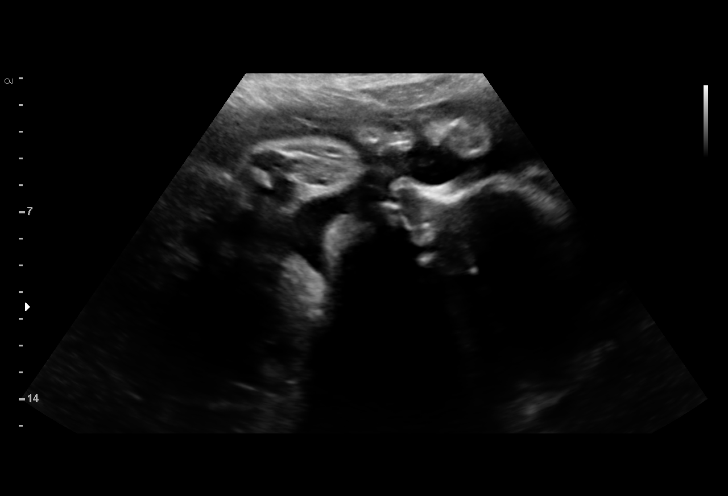
[im 47/55]
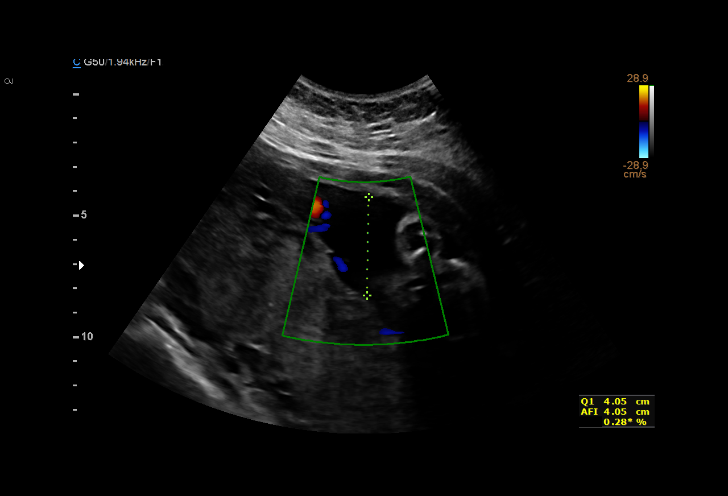
[im 51/55]
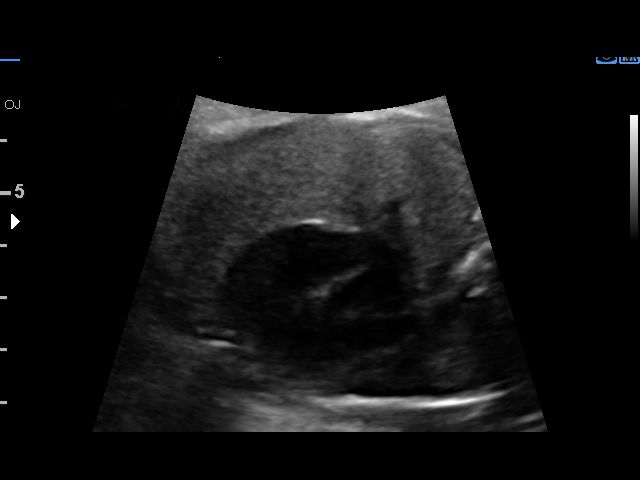
[im 55/55]
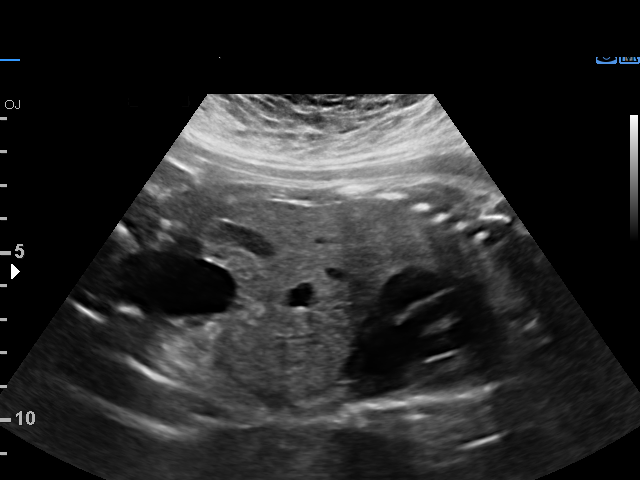

[14 of 28 positions shown; findings below may reference images not displayed]

Road [HOSPITAL]

1  [facility]A JUMPER            [id number]      [id number]     [id number]
2  [facility]A J[facility]ER            [id number]      [id number]     [id number]
3  NYA JUMPER            [id number]      [id number]     [id number]
Indications

33 weeks gestation of pregnancy
Maternal care for known or suspected poor
fetal growth, third trimester, not applicable or
unspecified
Obesity complicating pregnancy, third
trimester
OB History

Gravidity:    2         Term:   0        Prem:   0        SAB:   1
TOP:          0       Ectopic:  0        Living: 0
Fetal Evaluation

Num Of Fetuses:     1
Cardiac Activity:   Observed
Presentation:       Cephalic
Placenta:           Posterior, above cervical os
P. Cord Insertion:  Previously Visualized

Amniotic Fluid
AFI FV:      Subjectively low-normal

AFI Sum(cm)     %Tile       Largest Pocket(cm)
8.39            6

RUQ(cm)       RLQ(cm)                      LLQ(cm)
4.05
Biophysical Evaluation

Amniotic F.V:   Within normal limits       F. Tone:        Observed
F. Movement:    Observed                   Score:          [DATE]
F. Breathing:   Observed
Biometry

BPD:      78.6  mm     G. Age:  31w 4d          5  %    CI:        75.52   %   70 - 86
FL/HC:      21.7   %   19.9 -
HC:      286.8  mm     G. Age:  31w 3d        < 3  %    HC/AC:      1.00       0.96 -
AC:      285.6  mm     G. Age:  32w 4d         28  %    FL/BPD:     79.0   %   71 - 87
FL:       62.1  mm     G. Age:  32w 1d         13  %    FL/AC:      21.7   %   20 - 24
HUM:      56.5  mm     G. Age:  32w 6d         46  %

Est. FW:    0162  gm      4 lb 4 oz     36  %
Gestational Age

LMP:           35w 1d       Date:   09/18/15                 EDD:   06/24/16
U/S Today:     32w 0d                                        EDD:   07/16/16
Best:          33w 3d    Det. By:   Early Ultrasound         EDD:   07/06/16
(12/25/15)
Anatomy

Cranium:               Appears normal         Aortic Arch:            Appears normal
Cavum:                 Appears normal         Ductal Arch:            Previously seen
Ventricles:            Appears normal         Diaphragm:              Previously seen
Choroid Plexus:        Previously seen        Stomach:                Appears normal, left
sided
Cerebellum:            Previously seen        Abdomen:                Appears normal
Posterior Fossa:       Previously seen        Abdominal Wall:         Previously seen
Nuchal Fold:           Not applicable (>20    Cord Vessels:           Previously seen
wks GA)
Face:                  Orbits and profile     Kidneys:                Appear normal
previously seen
Lips:                  Previously seen        Bladder:                Appears normal
Thoracic:              Appears normal         Spine:                  Previously seen
Heart:                 Previously seen        Upper Extremities:      Previously seen
RVOT:                  Previously seen        Lower Extremities:      Previously seen
LVOT:                  Previously seen

Other:  Male gender previously seen. Heels and 5th digit previously
visualized. Nasal bone previously visualized.
Doppler - Fetal Vessels

Umbilical Artery
S/D     %tile     RI              PI              PSV
(cm/s)
3.71       93

Impression

SIUP at 33+3 weeks
Normal interval anatomy; anatomic survey complete
Normal amniotic fluid volume
Appropriate interval growth with EFW at the 36th %tile; AC at
the 28th %tile
UA dopplers were in the high normal range for this GA
BPP [DATE]
Recommendations

Follow-up ultrasound for growth in 3 weeks

## 2017-09-25 ENCOUNTER — Other Ambulatory Visit: Payer: Self-pay

## 2017-09-25 NOTE — Telephone Encounter (Signed)
Patient needs to schedule AEX. Call office to schedule appt.

## 2017-10-02 ENCOUNTER — Ambulatory Visit: Payer: Self-pay | Admitting: Certified Nurse Midwife

## 2017-10-06 ENCOUNTER — Other Ambulatory Visit: Payer: Self-pay | Admitting: Pediatrics

## 2017-10-12 ENCOUNTER — Emergency Department (HOSPITAL_COMMUNITY): Payer: Medicaid Other

## 2017-10-12 ENCOUNTER — Emergency Department (HOSPITAL_COMMUNITY)
Admission: EM | Admit: 2017-10-12 | Discharge: 2017-10-13 | Disposition: A | Discharge status: Home / Self Care | Payer: Medicaid Other | Attending: Emergency Medicine | Admitting: Emergency Medicine

## 2017-10-12 ENCOUNTER — Encounter (HOSPITAL_COMMUNITY): Payer: Self-pay

## 2017-10-12 ENCOUNTER — Other Ambulatory Visit: Payer: Self-pay

## 2017-10-12 DIAGNOSIS — Z79899 Other long term (current) drug therapy: Secondary | ICD-10-CM | POA: Diagnosis not present

## 2017-10-12 DIAGNOSIS — R079 Chest pain, unspecified: Secondary | ICD-10-CM | POA: Insufficient documentation

## 2017-10-12 LAB — I-STAT BETA HCG BLOOD, ED (MC, WL, AP ONLY): I-stat hCG, quantitative: <5 m[IU]/mL (ref <5)

## 2017-10-12 NOTE — ED Notes (Signed)
Patient transported to X-ray 

## 2017-10-12 NOTE — ED Provider Notes (Signed)
[facility]-EMERGENCY DEPT Provider Note   CSN: [id number] Arrival date & time: 10/12/17  1932     History   Chief Complaint Chief Complaint  Patient presents with  . Pleurisy    HPI [name] is a 22 y.o. female.  Patient presents for evaluation of chest pain that started earlier this morning. She describes pain with breathing, especially on exhale. No chest injury or trauma. No cough or fever. She states that over the course of the day the discomfort has extended to back. No nausea, vomiting. She has felt her heart racing at times. No history of blood clots. She is on oral birth control. She is a nonsmoker.   The history is provided by the patient. No language interpreter was used.    Past Medical History:  Diagnosis Date  . Medical history non-contributory     Patient Active Problem List   Diagnosis Date Noted  . Gestational hypertension, third trimester 07/05/2016  . SGA (small for gestational age), fetal, affecting care of mother, antepartum 04/10/2016  . Supervision of high-risk pregnancy 02/14/2016    Past Surgical History:  Procedure Laterality Date  . DILATION AND CURETTAGE OF UTERUS      OB History    Gravida Para Term Preterm AB Living   2 1 1   1 1    SAB TAB Ectopic Multiple Live Births   1     0 1       Home Medications    Prior to Admission medications   Medication Sig Start Date End Date Taking? Authorizing Provider  amLODipine (NORVASC) 5 MG tablet Take 1 tablet (5 mg total) by mouth daily. 07/08/16   Willodean Rosenthal, MD  ibuprofen (ADVIL,MOTRIN) 600 MG tablet Take 1 tablet (600 mg total) by mouth every 6 (six) hours. 07/08/16   Willodean Rosenthal, MD  iron polysaccharides (NIFEREX) 150 MG capsule Take 1 capsule (150 mg total) by mouth 2 (two) times daily. 07/08/16   Willodean Rosenthal, MD  norethindrone (ORTHO MICRONOR) 0.35 MG tablet Take 1 tablet (0.35 mg total) by mouth daily. 07/08/16    Willodean Rosenthal, MD  Prenat-FeAsp-Meth-FA-DHA w/o A (PRENATE PIXIE) 10-0.6-0.4-200 MG CAPS Take 1 tablet by mouth daily. 04/25/16   Roe Coombs, CNM    Family History Family History  Problem Relation Age of Onset  . Diabetes Paternal Grandmother   . Hypertension Paternal Grandmother     Social History Social History   Tobacco Use  . Smoking status: Never Smoker  . Smokeless tobacco: Never Used  Substance Use Topics  . Alcohol use: No    Alcohol/week: 0.0 oz  . Drug use: No     Allergies   Patient has no known allergies.   Review of Systems Review of Systems  Constitutional: Negative for chills and fever.  HENT: Negative.  Negative for congestion and sore throat.   Respiratory: Negative for cough.        C/O pleuritic chest pain  Cardiovascular: Positive for chest pain and palpitations. Negative for leg swelling.  Gastrointestinal: Negative.  Negative for nausea and vomiting.  Musculoskeletal: Negative.  Negative for myalgias.  Skin: Negative.   Neurological: Negative.      Physical Exam Updated Vital Signs BP (!) 131/97 (BP Location: Left Arm)   Pulse 79   Temp 98.7 F (37.1 C) (Oral)   Resp 16   Ht 5\' 5"  (1.651 m)   Wt 117 kg (258 lb)   SpO2 100%   BMI 42.93 kg/m  Physical Exam  Constitutional: She is oriented to person, place, and time. She appears well-developed and well-nourished.  HENT:  Head: Normocephalic.  Neck: Normal range of motion. Neck supple.  Cardiovascular: Normal rate and regular rhythm.  No murmur heard. Pulmonary/Chest: Effort normal and breath sounds normal. She has no wheezes. She has no rales. She exhibits no tenderness.  Abdominal: Soft. Bowel sounds are normal. There is no tenderness. There is no rebound and no guarding.  Musculoskeletal: Normal range of motion. She exhibits no edema or tenderness.  Neurological: She is alert and oriented to person, place, and time.  Skin: Skin is warm and dry. No rash noted.    Psychiatric: She has a normal mood and affect.     ED Treatments / Results  Labs (all labs ordered are listed, but only abnormal results are displayed) Labs Reviewed  D-DIMER, QUANTITATIVE (NOT [facility])  I-STAT BETA HCG BLOOD, ED ([facility], [facility], [facility] ONLY)    EKG  EKG Interpretation None       Radiology No results found.  Procedures Procedures (including critical care time)  Medications Ordered in ED Medications - No data to display   Initial Impression / Assessment and Plan / ED Course  I have reviewed the triage vital signs and the nursing notes.  Pertinent labs & imaging results that were available during my care of the patient were reviewed by me and considered in my medical decision making (see chart for details).     Patient presents with pleuritic chest pain, progressive through today. No cough or fever. Has risk factors for PE.   D-dimer pending. Patient is not pregnant. No tachycardia or hypoxia.   D-dimer is negative. CXR is clear. DDx chest wall pain vs reflux. Recommend prilosec and PCP follow up for recheck. She is felt appropriate for discharge home.   Final Clinical Impressions(s) / ED Diagnoses   Final diagnoses:  None   1. Nonspecific chest pain  ED Discharge Orders    None       Elpidio AnisUpstill, Denys Labree, PA-C 10/13/17 0033    Geoffery Lyonselo, Douglas, MD 10/13/17 [phone]

## 2017-10-12 NOTE — ED Triage Notes (Signed)
States since 0930 am today pain in center chest when takes a deep breath or movement reproduces pain in chest no other complaints no sob, not diaphoretic no nausea or vomiting no radiation of pain voiced.

## 2017-10-12 NOTE — ED Notes (Signed)
Pt called for triage, pt came in and then ask me to wait and she stepped back out to lobby

## 2017-10-12 NOTE — ED Notes (Addendum)
Pt called for triage, no answer

## 2017-10-13 LAB — D-DIMER, QUANTITATIVE: D-Dimer, Quant: 0.3 ug/mL-FEU (ref 0.00–0.50)

## 2017-10-13 MED ORDER — OMEPRAZOLE 20 MG PO CPDR: 20.0000 mg | DELAYED_RELEASE_CAPSULE | Freq: Every day | ORAL | 0 refills | Status: DC

## 2017-10-13 NOTE — Discharge Instructions (Signed)
Your tests are negative and your chest x-ray does not show infection. You can be discharged home. Recommend Prilosec daily and primary care follow up to insure improvement. Return with any worsening symptoms or new concerns.

## 2017-11-12 ENCOUNTER — Encounter: Payer: Self-pay | Admitting: Certified Nurse Midwife

## 2017-11-12 ENCOUNTER — Other Ambulatory Visit (HOSPITAL_COMMUNITY)
Admission: RE | Admit: 2017-11-12 | Discharge: 2017-11-12 | Disposition: A | Discharge status: Home / Self Care | Payer: Medicaid Other | Source: Ambulatory Visit | Attending: Certified Nurse Midwife | Admitting: Certified Nurse Midwife

## 2017-11-12 ENCOUNTER — Ambulatory Visit: Payer: Medicaid Other | Admitting: Certified Nurse Midwife

## 2017-11-12 VITALS — BP 125/85 | HR 85 | Ht 65.0 in | Wt 254.0 lb

## 2017-11-12 DIAGNOSIS — Z01419 Encounter for gynecological examination (general) (routine) without abnormal findings: Secondary | ICD-10-CM

## 2017-11-12 DIAGNOSIS — Z124 Encounter for screening for malignant neoplasm of cervix: Secondary | ICD-10-CM | POA: Insufficient documentation

## 2017-11-12 DIAGNOSIS — Z3201 Encounter for pregnancy test, result positive: Secondary | ICD-10-CM | POA: Diagnosis not present

## 2017-11-12 DIAGNOSIS — O2340 Unspecified infection of urinary tract in pregnancy, unspecified trimester: Secondary | ICD-10-CM

## 2017-11-12 DIAGNOSIS — Z0000 Encounter for general adult medical examination without abnormal findings: Secondary | ICD-10-CM

## 2017-11-12 DIAGNOSIS — R300 Dysuria: Secondary | ICD-10-CM | POA: Diagnosis not present

## 2017-11-12 DIAGNOSIS — Z113 Encounter for screening for infections with a predominantly sexual mode of transmission: Secondary | ICD-10-CM

## 2017-11-12 DIAGNOSIS — E65 Localized adiposity: Secondary | ICD-10-CM

## 2017-11-12 DIAGNOSIS — O200 Threatened abortion: Secondary | ICD-10-CM

## 2017-11-12 DIAGNOSIS — N926 Irregular menstruation, unspecified: Secondary | ICD-10-CM

## 2017-11-12 LAB — POCT URINE PREGNANCY: Preg Test, Ur: POSITIVE — AB

## 2017-11-12 LAB — POCT URINALYSIS DIPSTICK
BILIRUBIN UA: NEGATIVE
Blood, UA: 3
Glucose, UA: NEGATIVE
KETONES UA: NEGATIVE
Leukocytes, UA: NEGATIVE
Nitrite, UA: POSITIVE
PH UA: 6 (ref 5.0–8.0)
Protein, UA: NEGATIVE
Spec Grav, UA: >=1.03 — AB (ref 1.010–1.025)
UROBILINOGEN UA: 0.2 U/dL

## 2017-11-12 MED ORDER — VITAFOL-NANO 18-0.6-0.4 MG PO TABS: 1.0000 | ORAL_TABLET | Freq: Every day | ORAL | 12 refills | Status: DC

## 2017-11-12 MED ORDER — NITROFURANTOIN MONOHYD MACRO 100 MG PO CAPS: 100.0000 mg | ORAL_CAPSULE | Freq: Two times a day (BID) | ORAL | 0 refills | Status: DC

## 2017-11-12 NOTE — Progress Notes (Signed)
LMP:11/10/2017 Mammogram: N/A  Last Pap: 2018 per pt WNL no Hx of abnormal pap per pt done at different office.  Colonoscopy: N/A  Contraception: Birth Control PIlls PCP: None   STD Screening: Desires full panel  CC: Want to change birth control. Pt states she is not doing well w/ remembering pills. Dysuria and off/on yeast infection.

## 2017-11-12 NOTE — Progress Notes (Signed)
Subjective:        [name] is a 22 y.o. female here for a routine exam.  Current complaints: vaginal discharge, change in birth control.  Desires full STD screening.  Reports normal period in December around the start of the month.  States period started two days ago and started as light spotting.  Has a history of incomplete abortion and this episode reminds her of that experience.  States had clots yesterday, does not have pictures of this.   Was taking POP irregularly.  Nuva Ring vaginal insert discussed for contraception.   Patient denies any cramping or severe abdominal pain.  States that she will be traveling to see family in [place] next few weeks.   Personal health questionnaire:  Is patient Ashkenazi Jewish, have a family history of breast and/or ovarian cancer: no Is there a family history of uterine cancer diagnosed at age < 47, gastrointestinal cancer, urinary tract cancer, family member who is a Personnel officer syndrome-associated carrier: no Is the patient overweight and hypertensive, family history of diabetes, personal history of gestational diabetes, preeclampsia or PCOS: yes Is patient over 84, have PCOS,  family history of premature CHD under age 62, diabetes, smoke, have hypertension or peripheral artery disease:  no At any time, has a partner hit, kicked or otherwise hurt or frightened you?: no Over the past 2 weeks, have you felt down, depressed or hopeless?: no Over the past 2 weeks, have you felt little interest or pleasure in doing things?:no   Gynecologic History Patient's last menstrual period was 11/10/2017 (exact date). Contraception: none Last Pap: unkown. Results were: normal according to the patient Last mammogram: n/a <40 years, no significant family history.   Obstetric History OB History  Gravida Para Term Preterm AB Living  2 1 1   1 1   SAB TAB Ectopic Multiple Live Births  1     0 1    # Outcome Date GA Lbr Len/2nd Weight Sex Delivery Anes PTL Lv  2  Term 07/06/16 [redacted]w[redacted]d 06:20 / 00:28 6 lb 10.2 oz (3.011 kg) M Vag-Spont None  LIV  1 SAB 11/08/14              Past Medical History:  Diagnosis Date  . Medical history non-contributory     Past Surgical History:  Procedure Laterality Date  . DILATION AND CURETTAGE OF UTERUS       Current Outpatient Medications:  .  norethindrone (ORTHO MICRONOR) 0.35 MG tablet, Take 1 tablet (0.35 mg total) by mouth daily., Disp: 1 Package, Rfl: 11 .  amLODipine (NORVASC) 5 MG tablet, Take 1 tablet (5 mg total) by mouth daily. (Patient not taking: Reported on 11/12/2017), Disp: 30 tablet, Rfl: 3 .  ibuprofen (ADVIL,MOTRIN) 600 MG tablet, Take 1 tablet (600 mg total) by mouth every 6 (six) hours. (Patient not taking: Reported on 11/12/2017), Disp: 30 tablet, Rfl: 0 .  iron polysaccharides (NIFEREX) 150 MG capsule, Take 1 capsule (150 mg total) by mouth 2 (two) times daily. (Patient not taking: Reported on 11/12/2017), Disp: 60 capsule, Rfl: 3 .  nitrofurantoin, macrocrystal-monohydrate, (MACROBID) 100 MG capsule, Take 1 capsule (100 mg total) by mouth 2 (two) times daily., Disp: 14 capsule, Rfl: 0 .  omeprazole (PRILOSEC) 20 MG capsule, Take 1 capsule (20 mg total) by mouth daily. (Patient not taking: Reported on 11/12/2017), Disp: 20 capsule, Rfl: 0 .  Prenat-FeAsp-Meth-FA-DHA w/o A (PRENATE PIXIE) 10-0.6-0.4-200 MG CAPS, Take 1 tablet by mouth daily. (Patient not taking: Reported  on 11/12/2017), Disp: 30 capsule, Rfl: 12 .  Prenatal-Fe Fum-Methf-FA w/o A (VITAFOL-NANO) 18-0.6-0.4 MG TABS, Take 1 tablet by mouth daily., Disp: 30 tablet, Rfl: 12 No Known Allergies  Social History   Tobacco Use  . Smoking status: Never Smoker  . Smokeless tobacco: Never Used  Substance Use Topics  . Alcohol use: No    Alcohol/week: 0.0 oz    Family History  Problem Relation Age of Onset  . Diabetes Paternal Grandmother   . Hypertension Paternal Grandmother       Review of Systems  Constitutional: negative for fatigue and  weight loss Respiratory: negative for cough and wheezing Cardiovascular: negative for chest pain, fatigue and palpitations Gastrointestinal: negative for abdominal pain and change in bowel habits Musculoskeletal:negative for myalgias Neurological: negative for gait problems and tremors Behavioral/Psych: negative for abusive relationship, depression Endocrine: negative for temperature intolerance    Genitourinary:negative for abnormal menstrual periods, genital lesions, hot flashes, sexual problems and vaginal discharge Integument/breast: negative for breast lump, breast tenderness, nipple discharge and skin lesion(s)    Objective:       BP 125/85   Pulse 85   Ht 5\' 5"  (1.651 m)   Wt 254 lb (115.2 kg)   LMP 11/10/2017 (Exact Date) Comment: irregukar per pt   Breastfeeding? No   BMI 42.27 kg/m  General:   alert  Skin:   no rash or abnormalities  Lungs:   clear to auscultation bilaterally  Heart:   regular rate and rhythm, S1, S2 normal, no murmur, click, rub or gallop  Breasts:   normal without suspicious masses, skin or nipple changes or axillary nodes  Abdomen:  normal findings: no organomegaly, soft, non-tender and no hernia obese  Pelvis:  External genitalia: normal general appearance Urinary system: urethral meatus normal and bladder without fullness, nontender Vaginal: normal without tenderness, induration or masses, + blood clots in vaginal vault noted, small pen head size dark rubera Cervix: normal appearance Adnexa: normal bimanual exam Uterus: anteverted and non-tender, normal size    Positive UPT    + Nitrites  Lab Review Urine pregnancy test Labs reviewed yes Radiologic studies reviewed no  50% of 45 min visit spent on counseling and coordination of care.   Assessment & Plan    Healthy female exam.   1. Screening examination for STD (sexually transmitted disease)    - Hepatitis B surface antigen - Hepatitis C antibody - HIV antibody - RPR  2.  Encounter for annual routine gynecological examination       3. Screening for cervical cancer     - Cytology - PAP  4. Irregular periods     - POCT urine pregnancy  5. Dysuria    - POCT Urinalysis Dipstick  6. Threatened abortion       POC discussed with Dr. Clearance CootsHarper - Beta hCG quant (ref lab) - CBC with Differential/Platelet - Comprehensive metabolic panel - US OB Comp Less 14 Wks; Future  7. Obese abdomen    - TSH - Hemoglobin A1c  8. Urinary tract infection affecting pregnancy      - nitrofurantoin, macrocrystal-monohydrate, (MACROBID) 100 MG capsule; Take 1 capsule (100 mg total) by mouth 2 (two) times daily.  Dispense: 14 capsule; Refill: 0 - Culture, OB Urine  9. Positive pregnancy test      - Prenatal-Fe Fum-Methf-FA w/o A (VITAFOL-NANO) 18-0.6-0.4 MG TABS; Take 1 tablet by mouth daily.  Dispense: 30 tablet; Refill: 12 - Culture, OB Urine    Education reviewed: calcium  supplements, depression evaluation, low fat, low cholesterol diet, safe sex/STD prevention, self breast exams, skin cancer screening and weight bearing exercise. Follow up in: 2 weeks.   Meds ordered this encounter  Medications  . nitrofurantoin, macrocrystal-monohydrate, (MACROBID) 100 MG capsule    Sig: Take 1 capsule (100 mg total) by mouth 2 (two) times daily.    Dispense:  14 capsule    Refill:  0  . Prenatal-Fe Fum-Methf-FA w/o A (VITAFOL-NANO) 18-0.6-0.4 MG TABS    Sig: Take 1 tablet by mouth daily.    Dispense:  30 tablet    Refill:  12   Orders Placed This Encounter  Procedures  . Culture, OB Urine  . US OB Comp Less 14 Wks    Standing Status:   Future    Standing Expiration Date:   01/13/2019    Order Specific Question:   Reason for Exam (SYMPTOM  OR DIAGNOSIS REQUIRED)    Answer:   probable miscarriage vs early normal pregnancy, irregular periods, with bleeding with clots in early pregnancy    Order Specific Question:   Preferred Imaging Location?    Answer:   [facility]   . Hepatitis B surface antigen  . Hepatitis C antibody  . HIV antibody  . RPR  . Beta hCG quant (ref lab)  . CBC with Differential/Platelet  . Comprehensive metabolic panel  . TSH  . Hemoglobin A1c  . POCT urine pregnancy  . POCT Urinalysis Dipstick   Need to obtain previous records for pap smears Possible management options include: Nuva Ring vaginal inserts if no viable pregnancy Follow up as scheduled.  F/U quant on Friday.

## 2017-11-13 ENCOUNTER — Encounter (HOSPITAL_COMMUNITY): Payer: Self-pay

## 2017-11-13 ENCOUNTER — Encounter: Payer: Self-pay | Admitting: Certified Nurse Midwife

## 2017-11-13 ENCOUNTER — Ambulatory Visit (INDEPENDENT_AMBULATORY_CARE_PROVIDER_SITE_OTHER): Payer: Medicaid Other | Admitting: Certified Nurse Midwife

## 2017-11-13 ENCOUNTER — Ambulatory Visit (HOSPITAL_COMMUNITY)
Admission: RE | Admit: 2017-11-13 | Discharge: 2017-11-13 | Disposition: A | Discharge status: Home / Self Care | Payer: Medicaid Other | Source: Ambulatory Visit | Attending: Certified Nurse Midwife | Admitting: Certified Nurse Midwife

## 2017-11-13 DIAGNOSIS — Z30015 Encounter for initial prescription of vaginal ring hormonal contraceptive: Secondary | ICD-10-CM

## 2017-11-13 DIAGNOSIS — Z3201 Encounter for pregnancy test, result positive: Secondary | ICD-10-CM

## 2017-11-13 DIAGNOSIS — O200 Threatened abortion: Secondary | ICD-10-CM

## 2017-11-13 LAB — COMPREHENSIVE METABOLIC PANEL
A/G RATIO: 1.1 — AB (ref 1.2–2.2)
ALK PHOS: 61 IU/L (ref 39–117)
ALT: 10 IU/L (ref 0–32)
AST: 10 IU/L (ref 0–40)
Albumin: 4 g/dL (ref 3.5–5.5)
BUN/Creatinine Ratio: 9 (ref 9–23)
BUN: 6 mg/dL (ref 6–20)
Bilirubin Total: 0.4 mg/dL (ref 0.0–1.2)
CO2: 22 mmol/L (ref 20–29)
Calcium: 9.4 mg/dL (ref 8.7–10.2)
Chloride: 104 mmol/L (ref 96–106)
Creatinine, Ser: 0.7 mg/dL (ref 0.57–1.00)
GFR calc Af Amer: 140 mL/min/{1.73_m2} (ref >59)
GFR calc non Af Amer: 122 mL/min/{1.73_m2} (ref >59)
GLOBULIN, TOTAL: 3.5 g/dL (ref 1.5–4.5)
Glucose: 89 mg/dL (ref 65–99)
POTASSIUM: 4.2 mmol/L (ref 3.5–5.2)
SODIUM: 140 mmol/L (ref 134–144)
Total Protein: 7.5 g/dL (ref 6.0–8.5)

## 2017-11-13 LAB — HEPATITIS C ANTIBODY

## 2017-11-13 LAB — HEMOGLOBIN A1C
Est. average glucose Bld gHb Est-mCnc: 114 mg/dL
HEMOGLOBIN A1C: 5.6 % (ref 4.8–5.6)

## 2017-11-13 LAB — CBC WITH DIFFERENTIAL/PLATELET
Basophils Absolute: 0 10*3/uL (ref 0.0–0.2)
Basos: 0 %
EOS (ABSOLUTE): 0.1 10*3/uL (ref 0.0–0.4)
EOS: 1 %
HEMATOCRIT: 36.3 % (ref 34.0–46.6)
Hemoglobin: 11.9 g/dL (ref 11.1–15.9)
Immature Grans (Abs): 0 10*3/uL (ref 0.0–0.1)
Immature Granulocytes: 0 %
LYMPHS ABS: 2 10*3/uL (ref 0.7–3.1)
Lymphs: 37 %
MCH: 25.2 pg — ABNORMAL LOW (ref 26.6–33.0)
MCHC: 32.8 g/dL (ref 31.5–35.7)
MCV: 77 fL — AB (ref 79–97)
MONOS ABS: 0.5 10*3/uL (ref 0.1–0.9)
Monocytes: 9 %
Neutrophils Absolute: 2.8 10*3/uL (ref 1.4–7.0)
Neutrophils: 53 %
Platelets: 362 10*3/uL (ref 150–379)
RBC: 4.72 x10E6/uL (ref 3.77–5.28)
RDW: 15.6 % — AB (ref 12.3–15.4)
WBC: 5.4 10*3/uL (ref 3.4–10.8)

## 2017-11-13 LAB — HEPATITIS B SURFACE ANTIGEN: Hepatitis B Surface Ag: NEGATIVE

## 2017-11-13 LAB — RPR: RPR Ser Ql: NONREACTIVE

## 2017-11-13 LAB — HIV ANTIBODY (ROUTINE TESTING W REFLEX): HIV SCREEN 4TH GENERATION: NONREACTIVE

## 2017-11-13 LAB — TSH: TSH: 1.22 u[IU]/mL (ref 0.450–4.500)

## 2017-11-13 LAB — BETA HCG QUANT (REF LAB): hCG Quant: <1 m[IU]/mL

## 2017-11-13 MED ORDER — ETONOGESTREL-ETHINYL ESTRADIOL 0.12-0.015 MG/24HR VA RING: VAGINAL_RING | VAGINAL | 12 refills | Status: DC

## 2017-11-13 NOTE — Patient Instructions (Addendum)
Hormonal Contraception Information °Hormonal contraception is a type of birth control that uses hormones to prevent pregnancy. It usually involves a combination of the hormones estrogen and progesterone or only the hormone progesterone. Hormonal contraception works in these ways: °· It thickens the mucus in the cervix, making it harder for sperm to enter the uterus. °· It changes the lining of the uterus, making it harder for an egg to implant. °· It may stop the ovaries from releasing eggs (ovulation). Some women who take hormonal contraceptives that contain only progesterone may continue to ovulate. ° °Hormonal contraception cannot prevent sexually transmitted infections (STIs). Pregnancy may still occur. °Estrogen and progesterone contraceptives °Contraceptives that use a combination of estrogen and progesterone are available in these forms: °· Pill. Pills come in different combinations of hormones. They must be taken at the same time each day. Pills can affect your period, causing you to get your period once every three months or not at all. °· Patch. The patch must be worn on the lower abdomen for three weeks and then removed on the fourth. °· Vaginal ring. The ring is placed in the vagina and left there for three weeks. It is then removed for one week. ° °Progesterone contraceptives °Contraceptives that use progesterone only are available in these forms: °· Pill. Pills should be taken every day of the cycle. °· Intrauterine device (IUD). This device is inserted into the uterus and removed or replaced every five years or sooner. °· Implant. Plastic rods are placed under the skin of the upper arm. They are removed or replaced every three years or sooner. °· Injection. The injection is given once every 90 days. ° °What are the side effects? °The side effects of estrogen and progesterone contraceptives include: °· Nausea. °· Headaches. °· Breast tenderness. °· Bleeding or spotting between menstrual cycles. °· High  blood pressure (rare). °· Strokes, heart attacks, or blood clots (rare) ° °Side effects of progesterone-only contraceptives include: °· Nausea. °· Headaches. °· Breast tenderness. °· Unpredictable menstrual bleeding. °· High blood pressure (rare). ° °Talk to your health care provider about what side effects may affect you. °Where to find more information: °· Ask your health care provider for more information and resources about hormonal contraception. °· U.[place] Department of Health and Human Services Office on Women's Health: www.womenshealth.gov °Questions to ask: °· What type of hormonal contraception is right for me? °· How long should I plan to use hormonal contraception? °· What are the side effects of the hormonal contraception method I choose? °· How can I prevent STIs while using hormonal contraception? °Contact a health care provider if: °· You start taking hormonal contraceptives and you develop persistent or severe side effects. °Summary °· Estrogen and progesterone are hormones used in many forms of birth control. °· Talk to your health care provider about what side effects may affect you. °· Hormonal contraception cannot prevent sexually transmitted infections (STIs). °· Ask your health care provider for more information and resources about hormonal contraception. °This information is not intended to replace advice given to you by your health care provider. Make sure you discuss any questions you have with your health care provider. °Document Released: 09/15/2007 Document Revised: 07/26/2016 Document Reviewed: 07/26/2016 °[employer]vier Interactive Patient Education © 2018 [employer] ° °

## 2017-11-13 NOTE — Progress Notes (Signed)
Subjective:    [name] is a 22 y.o. female who presents for contraception counseling. The patient has no complaints today. The patient is sexually active. Pertinent past medical history: urinary tract infections. Diagnosed 11/12/17 with UTI.  Here for f/u after positive UPT in office with a negative HCG quant.  Discussed lab results with patient that were back.  States irregular periods since she was 8; were regular with weight loss as a teen then reverted back to irregular with pregnancies. States that her period is more regular today as far as bleeding goes and is having some cramping.    The information documented in the HPI was reviewed and verified.  Menstrual History: OB History    Gravida Para Term Preterm AB Living   2 1 1   1 1    SAB TAB Ectopic Multiple Live Births   1     0 1      Menarche age: 73 Patient's last menstrual period was 11/10/2017 (exact date).   There are no active problems to display for this patient.  Past Medical History:  Diagnosis Date  . Medical history non-contributory     Past Surgical History:  Procedure Laterality Date  . DILATION AND CURETTAGE OF UTERUS       Current Outpatient Medications:  .  amLODipine (NORVASC) 5 MG tablet, Take 1 tablet (5 mg total) by mouth daily. (Patient not taking: Reported on 11/12/2017), Disp: 30 tablet, Rfl: 3 .  etonogestrel-ethinyl estradiol (NUVARING) 0.12-0.015 MG/24HR vaginal ring, Insert vaginally and leave in place for 3 consecutive weeks, then remove for 1 week., Disp: 1 each, Rfl: 12 .  ibuprofen (ADVIL,MOTRIN) 600 MG tablet, Take 1 tablet (600 mg total) by mouth every 6 (six) hours. (Patient not taking: Reported on 11/12/2017), Disp: 30 tablet, Rfl: 0 .  iron polysaccharides (NIFEREX) 150 MG capsule, Take 1 capsule (150 mg total) by mouth 2 (two) times daily. (Patient not taking: Reported on 11/12/2017), Disp: 60 capsule, Rfl: 3 .  nitrofurantoin, macrocrystal-monohydrate, (MACROBID) 100 MG capsule, Take 1  capsule (100 mg total) by mouth 2 (two) times daily., Disp: 14 capsule, Rfl: 0 .  norethindrone (ORTHO MICRONOR) 0.35 MG tablet, Take 1 tablet (0.35 mg total) by mouth daily., Disp: 1 Package, Rfl: 11 .  omeprazole (PRILOSEC) 20 MG capsule, Take 1 capsule (20 mg total) by mouth daily. (Patient not taking: Reported on 11/12/2017), Disp: 20 capsule, Rfl: 0 .  Prenat-FeAsp-Meth-FA-DHA w/o A (PRENATE PIXIE) 10-0.6-0.4-200 MG CAPS, Take 1 tablet by mouth daily. (Patient not taking: Reported on 11/12/2017), Disp: 30 capsule, Rfl: 12 .  Prenatal-Fe Fum-Methf-FA w/o A (VITAFOL-NANO) 18-0.6-0.4 MG TABS, Take 1 tablet by mouth daily., Disp: 30 tablet, Rfl: 12 Not on File  Social History   Tobacco Use  . Smoking status: Never Smoker  . Smokeless tobacco: Never Used  Substance Use Topics  . Alcohol use: No    Alcohol/week: 0.0 oz    Family History  Problem Relation Age of Onset  . Diabetes Paternal Grandmother   . Hypertension Paternal Grandmother        Review of Systems Constitutional: negative for weight loss Genitourinary:negative for abnormal menstrual periods and vaginal discharge     Objective:   LMP 11/10/2017 (Exact Date) Comment: irregukar per pt    General:   alert  Deferred  Lab Review Urine pregnancy test Labs reviewed yes, Negative quant, normal CBC, CMP, TSH, A1C Radiologic studies reviewed no  100% of 15 min visit spent on counseling and coordination  of care.    Assessment:    22 y.o., starting NuvaRing vaginal inserts, no contraindications.   Plan:    All questions answered. Meds ordered this encounter  Medications  . etonogestrel-ethinyl estradiol (NUVARING) 0.12-0.015 MG/24HR vaginal ring    Sig: Insert vaginally and leave in place for 3 consecutive weeks, then remove for 1 week.    Dispense:  1 each    Refill:  12   Consider PCOS work-up.  Need to obtain previous records Follow up in 3 months.         Reviewed all forms of birth control options  available including abstinence; fertility period awareness methods; over the counter/barrier methods; hormonal contraceptive medication including pill, patch, ring, injection,contraceptive implant; hormonal and nonhormonal IUDs; permanent sterilization options including vasectomy and the various tubal sterilization modalities. Risks and benefits reviewed.  Questions were answered.  Information was given to patient to review.

## 2017-11-13 NOTE — Addendum Note (Signed)
Addended by: Tim LairLARK, Chenell Lozon on: 11/13/2017 08:09 AM   Modules accepted: Orders

## 2017-11-15 LAB — URINE CULTURE, OB REFLEX

## 2017-11-15 LAB — CERVICOVAGINAL ANCILLARY ONLY
Bacterial vaginitis: POSITIVE — AB
CHLAMYDIA, DNA PROBE: NEGATIVE
Candida vaginitis: POSITIVE — AB
NEISSERIA GONORRHEA: NEGATIVE
TRICH (WINDOWPATH): NEGATIVE

## 2017-11-15 LAB — CULTURE, OB URINE

## 2017-11-18 ENCOUNTER — Other Ambulatory Visit: Payer: Self-pay | Admitting: Certified Nurse Midwife

## 2017-11-18 DIAGNOSIS — N760 Acute vaginitis: Principal | ICD-10-CM

## 2017-11-18 DIAGNOSIS — B373 Candidiasis of vulva and vagina: Secondary | ICD-10-CM

## 2017-11-18 DIAGNOSIS — B9689 Other specified bacterial agents as the cause of diseases classified elsewhere: Secondary | ICD-10-CM

## 2017-11-18 DIAGNOSIS — B3731 Acute candidiasis of vulva and vagina: Secondary | ICD-10-CM

## 2017-11-18 LAB — CYTOLOGY - PAP
DIAGNOSIS: REACTIVE
Diagnosis: NEGATIVE

## 2017-11-18 MED ORDER — TERCONAZOLE 0.8 % VA CREA: 1.0000 | TOPICAL_CREAM | Freq: Every day | VAGINAL | 0 refills | Status: DC

## 2017-11-18 MED ORDER — METRONIDAZOLE 500 MG PO TABS: 500.0000 mg | ORAL_TABLET | Freq: Two times a day (BID) | ORAL | 0 refills | Status: DC

## 2017-11-18 MED ORDER — FLUCONAZOLE 200 MG PO TABS: 200.0000 mg | ORAL_TABLET | Freq: Once | ORAL | 0 refills | Status: AC

## 2017-11-19 ENCOUNTER — Other Ambulatory Visit: Payer: Self-pay | Admitting: Certified Nurse Midwife

## 2017-11-21 ENCOUNTER — Other Ambulatory Visit: Payer: Self-pay | Admitting: Certified Nurse Midwife

## 2017-11-26 ENCOUNTER — Ambulatory Visit: Payer: Self-pay | Admitting: Certified Nurse Midwife

## 2018-06-03 ENCOUNTER — Ambulatory Visit: Payer: Self-pay | Admitting: Obstetrics and Gynecology

## 2018-11-17 ENCOUNTER — Ambulatory Visit (INDEPENDENT_AMBULATORY_CARE_PROVIDER_SITE_OTHER): Payer: Medicaid Other

## 2018-11-17 ENCOUNTER — Other Ambulatory Visit (HOSPITAL_COMMUNITY)
Admission: RE | Admit: 2018-11-17 | Discharge: 2018-11-17 | Disposition: A | Discharge status: Home / Self Care | Payer: Medicaid Other | Source: Ambulatory Visit

## 2018-11-17 VITALS — BP 136/78 | HR 86 | Ht 65.0 in | Wt 262.0 lb

## 2018-11-17 DIAGNOSIS — Z30015 Encounter for initial prescription of vaginal ring hormonal contraceptive: Secondary | ICD-10-CM

## 2018-11-17 DIAGNOSIS — Z0000 Encounter for general adult medical examination without abnormal findings: Secondary | ICD-10-CM | POA: Diagnosis not present

## 2018-11-17 DIAGNOSIS — Z3202 Encounter for pregnancy test, result negative: Secondary | ICD-10-CM

## 2018-11-17 DIAGNOSIS — Z1239 Encounter for other screening for malignant neoplasm of breast: Secondary | ICD-10-CM

## 2018-11-17 DIAGNOSIS — Z01419 Encounter for gynecological examination (general) (routine) without abnormal findings: Secondary | ICD-10-CM

## 2018-11-17 LAB — POCT URINE PREGNANCY: PREG TEST UR: NEGATIVE

## 2018-11-17 MED ORDER — ETONOGESTREL-ETHINYL ESTRADIOL 0.12-0.015 MG/24HR VA RING: VAGINAL_RING | VAGINAL | 12 refills | Status: DC

## 2018-11-17 NOTE — Patient Instructions (Signed)
Ethinyl Estradiol; Etonogestrel vaginal ring  What is this medicine?  ETHINYL ESTRADIOL; ETONOGESTREL (ETH in il es tra DYE ole; et oh noe JES trel) vaginal ring is a flexible, vaginal ring used as a contraceptive (birth control method). This medicine combines 2 types of female hormones, an estrogen and a progestin. This ring is used to prevent ovulation and pregnancy. Each ring is effective for 1 month.  This medicine may be used for other purposes; ask your health care provider or pharmacist if you have questions.  COMMON BRAND NAME(S): NuvaRing  What should I tell my health care provider before I take this medicine?  They need to know if you have any of these conditions:  -abnormal vaginal bleeding  -blood vessel disease or blood clots  -breast, cervical, endometrial, ovarian, liver, or uterine cancer  -diabetes  -gallbladder disease  -having surgery  -heart disease or recent heart attack  -high blood pressure  -high cholesterol or triglycerides  -history of irregular heartbeat or heart valve problems  -kidney disease  -liver disease  -migraine headaches  -protein C deficiency  -protein S deficiency  -recently had a baby, miscarriage, or abortion  -stroke  -systemic lupus erythematosus (SLE)  -tobacco smoker  -your age is more than 25 years old  -an unusual or allergic reaction to estrogens, progestins, other medicines, foods, dyes, or preservatives  -pregnant or trying to get pregnant  -breast-feeding  How should I use this medicine?  Insert the ring into your vagina as directed. Follow the directions on the prescription label. The ring will remain place for 3 weeks and is then removed for a 1-week break. A new ring is inserted 1 week after the last ring was removed, on the same day of the week. Check often to make sure the ring is still in place. If the ring was out of the vagina for an unknown amount of time, you may not be protected from pregnancy. Perform a pregnancy test and call your doctor. Do not use  more often than directed.  A patient package insert for the product will be given with each prescription and refill. Read this sheet carefully each time. The sheet may change frequently.  Contact your pediatrician regarding the use of this medicine in children. Special care may be needed.  Overdosage: If you think you have taken too much of this medicine contact a poison control center or emergency room at once.  NOTE: This medicine is only for you. Do not share this medicine with others.  What if I miss a dose?  You will need to use the ring exactly as directed. It is very important to follow the schedule every cycle. If you do not use the ring as directed, you may not be protected from pregnancy. If the ring should slip out, is lost, or if you leave it in longer or shorter than you should, contact your health care professional for advice.  What may interact with this medicine?  Do not take this medicine with the following medications:  -dasabuvir; ombitasvir; paritaprevir; ritonavir  -ombitasvir; paritaprevir; ritonavir  -vaginal lubricants or other vaginal products that are oil-based or silicone-based  This medicine may also interact with the following medications:  -acetaminophen  -antibiotics or medicines for infections, especially rifampin, rifabutin, rifapentine, and griseofulvin, and possibly penicillins or tetracyclines  -aprepitant or fosaprepitant  -armodafinil  -ascorbic acid (vitamin C)  -barbiturate medicines, such as phenobarbital or primidone  -bosentan  -certain antiviral medicines for hepatitis, HIV or AIDS  -certain   medicines for cancer treatment  -certain medicines for seizures like carbamazepine, clobazam, felbamate, lamotrigine, oxcarbazepine, phenytoin, rufinamide, topiramate  -certain medicines for treating high cholesterol  -cyclosporine  -dantrolene  -elagolix  -flibanserin  -grapefruit juice  -lesinurad  -medicines for diabetes  -medicines to treat fungal infections, such as griseofulvin,  miconazole, fluconazole, ketoconazole, itraconazole, posaconazole or voriconazole  -mifepristone  -mitotane  -modafinil  -morphine  -mycophenolate  -St. John's wort  -tamoxifen  -temazepam  -theophylline or aminophylline  -thyroid hormones  -tizanidine  -tranexamic acid  -ulipristal  -warfarin  This list may not describe all possible interactions. Give your health care provider a list of all the medicines, herbs, non-prescription drugs, or dietary supplements you use. Also tell them if you smoke, drink alcohol, or use illegal drugs. Some items may interact with your medicine.  What should I watch for while using this medicine?  Visit your doctor or health care professional for regular checks on your progress. You will need a regular breast and pelvic exam and Pap smear while on this medicine.  Check with your doctor or health care professional to see if you need an additional method of contraception during the first cycle that you use this ring. Female condoms (made with natural rubber latex, polyisoprene, and polyurethane) and spermicides may be used. Do not use a diaphragm, cervical cap, or a female condom, as the ring can interfere with these birth control methods and their proper placement.  If you have any reason to think you are pregnant, stop using this medicine right away and contact your doctor or health care professional.  If you are using this medicine for hormone related problems, it may take several cycles of use to see improvement in your condition.  Smoking increases the risk of getting a blood clot or having a stroke while you are using hormonal birth control, especially if you are more than 25 years old. You are strongly advised not to smoke.  Some women are prone to getting dark patches on the skin of the face (cholasma). Your risk of getting chloasma with this medicine is higher if you had chloasma during a pregnancy. Keep out of the sun. If you cannot avoid being in the sun, wear protective  clothing and use sunscreen. Do not use sun lamps or tanning beds/booths.  This medicine can make your body retain fluid, making your fingers, hands, or ankles swell. Your blood pressure can go up. Contact your doctor or health care professional if you feel you are retaining fluid.  If you are going to have elective surgery, you may need to stop using this medicine before the surgery. Consult your health care professional for advice.  This medicine does not protect you against HIV infection (AIDS) or any other sexually transmitted diseases.  What side effects may I notice from receiving this medicine?  Side effects that you should report to your doctor or health care professional as soon as possible:  -allergic reactions such as skin rash or itching, hives, swelling of the lips, mouth, tongue, or throat  -depression  -high blood pressure  -migraines or severe, sudden headaches  -signs and symptoms of a blood clot such as breathing problems; changes in vision; chest pain; severe, sudden headache; pain, swelling, warmth in the leg; trouble speaking; sudden numbness or weakness of the face, arm or leg  -signs and symptoms of infection like fever or chills with dizziness and a sunburn-like rash, or pain or trouble passing urine  -stomach pain  -symptoms of   vaginal infection like itching, irritation or unusual discharge  -yellowing of the eyes or skin  Side effects that usually do not require medical attention (report these to your doctor or health care professional if they continue or are bothersome):  -acne  -breast pain, tenderness  -irregular vaginal bleeding or spotting, particularly during the first month of use  -mild headache  -nausea  -painful periods  -vomiting  This list may not describe all possible side effects. Call your doctor for medical advice about side effects. You may report side effects to FDA at 1-800-FDA-1088.  Where should I keep my medicine?  Keep out of the reach of children.  Store unopened  rings in the original foil pouch at room temperature between 20 and 25 degrees C (68 and 77 degrees F) for up to 4 months. Protect from light. Do not store above 30 degrees C (86 degrees F). Throw away any unused medicine after the expiration date. A ring may only be used for 1 cycle (1 month). After the 3-week cycle, a used ring is removed and should be placed in the re-closable foil pouch and discarded in the trash out of reach of children and pets. Do NOT flush down the toilet.  NOTE: This sheet is a summary. It may not cover all possible information. If you have questions about this medicine, talk to your doctor, pharmacist, or health care provider.  © 2019 [employer]/Gold Standard (2017-04-25 14:41:10)

## 2018-11-17 NOTE — Progress Notes (Signed)
History:  Ms. [name] is a 23 y.o. G2P1011 who presents to clinic today for annual exam.  Patient currently using Nuva Ring and expresses satisfaction with this method.   However, she expresses concern with her menses last month stating it was 3 days instead of 5-7 and was more spotting than a normal flow.  Patient does report some stress, but expresses concern about pregnancy and requests a pregnancy test.  Reports desire for more children in the future, but is in the middle of "a mutual divorce" and does not desire pregnancy currently. Patient also requests full STD testing.  Patient denies breast concerns or a family history of breast cancer. She endorses safety at home.    The following portions of the patient's history were reviewed and updated as appropriate: allergies, current medications, family history, past medical history, social history, past surgical history and problem list.  Review of Systems:  Review of Systems  Constitutional: Negative for fever.  Gastrointestinal: Negative for constipation and diarrhea.  Genitourinary: Negative for dysuria.      Objective:  Physical Exam BP 136/78   Pulse 86   Ht 5\' 5"  (1.651 m)   Wt 262 lb (118.8 kg)   LMP 10/19/2018 (Exact Date)   BMI 43.60 kg/m  Physical Exam Constitutional:      Appearance: Normal appearance.  HENT:     Head: Normocephalic and atraumatic.     Mouth/Throat:     Mouth: Mucous membranes are moist.  Eyes:     Conjunctiva/sclera: Conjunctivae normal.     Pupils: Pupils are equal, round, and reactive to light.  Neck:     Musculoskeletal: Normal range of motion.  Cardiovascular:     Rate and Rhythm: Normal rate and regular rhythm.     Heart sounds: Normal heart sounds.  Pulmonary:     Effort: Pulmonary effort is normal.  Chest:     Chest wall: No tenderness.     Breasts: Breasts are symmetrical.        Right: Normal. No nipple discharge, skin change or tenderness.        Left: Normal. No nipple  discharge, skin change or tenderness.  Abdominal:     General: Bowel sounds are normal.  Musculoskeletal: Normal range of motion.  Lymphadenopathy:     Upper Body:     Right upper body: No axillary adenopathy.     Left upper body: No axillary adenopathy.  Skin:    General: Skin is warm and dry.  Neurological:     Mental Status: She is alert.  Psychiatric:        Mood and Affect: Mood normal.        Behavior: Behavior normal.        Thought Content: Thought content normal.     Labs and Imaging Results for orders placed or performed in visit on 11/17/18 (from the past 24 hour(s))  POCT urine pregnancy     Status: Normal   Collection Time: 11/17/18  3:31 PM  Result Value Ref Range   Preg Test, Ur Negative Negative    No results found.   Assessment & Plan:  1. Encounter for initial prescription of vaginal ring hormonal contraceptive -Reports happiness with method -Rx sent to pharmacy on file for Nuvaring 0.12-0.15mg /24hr Disp 1, RF 12 -Instructed on proper usage of Nuvaring.  2. Encounter for well woman exam -Educated on ASCCP guidelines for well woman exam and no need for pelvic exam with pap smear. -Informed of previous results and  that next pap smear due March 2022. -Self swab for STD testing -Will perform UPT - Cervicovaginal ancillary only - HIV Antibody (routine testing w rflx) - RPR - POCT urine pregnancy -Informed of negative UPT results and will contact with any abnormal results. -No further questions or concerns.  Expresses relief with negative UPT.   3. Screening breast examination -Clinical breast exam given-Negative -Educated on self-breast awareness and given verbal information on how to perform intermittent breast exam.  *RTO in one year for annual exam or prn for issues Gerrit Heck, CNM 11/17/2018 3:46 PM

## 2018-11-18 LAB — HIV ANTIBODY (ROUTINE TESTING W REFLEX): HIV Screen 4th Generation wRfx: NONREACTIVE

## 2018-11-18 LAB — CERVICOVAGINAL ANCILLARY ONLY
CHLAMYDIA, DNA PROBE: NEGATIVE
NEISSERIA GONORRHEA: NEGATIVE

## 2018-11-18 LAB — RPR: RPR: NONREACTIVE

## 2020-03-22 ENCOUNTER — Encounter (HOSPITAL_COMMUNITY): Payer: Self-pay

## 2020-03-22 ENCOUNTER — Emergency Department (HOSPITAL_COMMUNITY)
Admission: EM | Admit: 2020-03-22 | Discharge: 2020-03-22 | Disposition: A | Discharge status: Home / Self Care | Payer: Medicaid Other | Attending: Emergency Medicine | Admitting: Emergency Medicine

## 2020-03-22 ENCOUNTER — Other Ambulatory Visit: Payer: Self-pay

## 2020-03-22 ENCOUNTER — Ambulatory Visit (HOSPITAL_COMMUNITY)
Admission: EM | Admit: 2020-03-22 | Discharge: 2020-03-22 | Disposition: A | Discharge status: Home / Self Care | Payer: Medicaid Other | Attending: Urgent Care | Admitting: Urgent Care

## 2020-03-22 DIAGNOSIS — L299 Pruritus, unspecified: Secondary | ICD-10-CM

## 2020-03-22 DIAGNOSIS — R21 Rash and other nonspecific skin eruption: Secondary | ICD-10-CM | POA: Diagnosis not present

## 2020-03-22 DIAGNOSIS — R2233 Localized swelling, mass and lump, upper limb, bilateral: Secondary | ICD-10-CM | POA: Diagnosis not present

## 2020-03-22 DIAGNOSIS — R2243 Localized swelling, mass and lump, lower limb, bilateral: Secondary | ICD-10-CM | POA: Insufficient documentation

## 2020-03-22 DIAGNOSIS — Z5321 Procedure and treatment not carried out due to patient leaving prior to being seen by health care provider: Secondary | ICD-10-CM | POA: Insufficient documentation

## 2020-03-22 DIAGNOSIS — L509 Urticaria, unspecified: Secondary | ICD-10-CM

## 2020-03-22 MED ORDER — METHYLPREDNISOLONE SODIUM SUCC 125 MG IJ SOLR
125.0000 mg | Freq: Once | INTRAMUSCULAR | Status: AC
  Administered 2020-03-22: 125 mg via INTRAMUSCULAR

## 2020-03-22 MED ORDER — METHYLPREDNISOLONE SODIUM SUCC 125 MG IJ SOLR
INTRAMUSCULAR | Status: AC
  Filled 2020-03-22: qty 2

## 2020-03-22 MED ORDER — HYDROXYZINE HCL 25 MG PO TABS: 12.5000 mg | ORAL_TABLET | Freq: Three times a day (TID) | ORAL | 0 refills | Status: DC | PRN

## 2020-03-22 NOTE — ED Provider Notes (Signed)
[facility]   MRN: [id number] DOB: November 07, 1995  Subjective:   [name] is a 24 y.o. female presenting for 3-day history of acute onset hives over her arms and intermittent hives on her forehead, one over her right knee.  Has also had intermittent swelling of the lip, legs and hands.  Patient was recently in [place], ate at a lot of different restaurants.  She states that those are all normal food she has eaten in the past.  Unfortunately she has also had allergic reactions in the past from eating out.  She has not been able to rest well due to significant itching, when she scratches the rash/hives burn.  Denies shortness of breath, nausea, vomiting, chest tightness, wheezing.  Previously has done well with an injection and avoiding eating out.  No current facility-administered medications for this encounter.  Current Outpatient Medications:    amLODipine (NORVASC) 5 MG tablet, Take 1 tablet (5 mg total) by mouth daily. (Patient not taking: Reported on 11/12/2017), Disp: 30 tablet, Rfl: 3   etonogestrel-ethinyl estradiol (NUVARING) 0.12-0.015 MG/24HR vaginal ring, Insert vaginally and leave in place for 3 consecutive weeks, then remove for 1 week., Disp: 1 each, Rfl: 12   ibuprofen (ADVIL,MOTRIN) 600 MG tablet, Take 1 tablet (600 mg total) by mouth every 6 (six) hours. (Patient not taking: Reported on 11/12/2017), Disp: 30 tablet, Rfl: 0   iron polysaccharides (NIFEREX) 150 MG capsule, Take 1 capsule (150 mg total) by mouth 2 (two) times daily. (Patient not taking: Reported on 11/12/2017), Disp: 60 capsule, Rfl: 3   metroNIDAZOLE (FLAGYL) 500 MG tablet, Take 1 tablet (500 mg total) by mouth 2 (two) times daily., Disp: 14 tablet, Rfl: 0   nitrofurantoin, macrocrystal-monohydrate, (MACROBID) 100 MG capsule, Take 1 capsule (100 mg total) by mouth 2 (two) times daily. (Patient not taking: Reported on 11/17/2018), Disp: 14 capsule, Rfl: 0   omeprazole (PRILOSEC) 20 MG capsule, Take 1  capsule (20 mg total) by mouth daily. (Patient not taking: Reported on 11/12/2017), Disp: 20 capsule, Rfl: 0   Prenat-FeAsp-Meth-FA-DHA w/o A (PRENATE PIXIE) 10-0.6-0.4-200 MG CAPS, Take 1 tablet by mouth daily. (Patient not taking: Reported on 11/12/2017), Disp: 30 capsule, Rfl: 12   Prenatal-Fe Fum-Methf-FA w/o A (VITAFOL-NANO) 18-0.6-0.4 MG TABS, Take 1 tablet by mouth daily. (Patient not taking: Reported on 11/17/2018), Disp: 30 tablet, Rfl: 12   terconazole (TERAZOL 3) 0.8 % vaginal cream, Place 1 applicator vaginally at bedtime., Disp: 20 g, Rfl: 0   No Known Allergies  Past Medical History:  Diagnosis Date   Medical history non-contributory      Past Surgical History:  Procedure Laterality Date   DILATION AND CURETTAGE OF UTERUS      Family History  Problem Relation Age of Onset   Diabetes Paternal Grandmother    Hypertension Paternal Grandmother     Social History   Tobacco Use   Smoking status: Never Smoker   Smokeless tobacco: Never Used  Vaping Use   Vaping Use: Never used  Substance Use Topics   Alcohol use: No    Alcohol/week: 0.0 standard drinks   Drug use: No    ROS   Objective:   Vitals: BP (!) 147/97 (BP Location: Right Wrist)    Pulse (!) 101    Temp 98.1 F (36.7 C) (Oral)    Resp 18    LMP 03/09/2020    SpO2 97%   Physical Exam Constitutional:      General: She is not in acute  distress.    Appearance: Normal appearance. She is well-developed. She is obese. She is not ill-appearing, toxic-appearing or diaphoretic.  HENT:     Head: Normocephalic and atraumatic.     Nose: Nose normal.     Mouth/Throat:     Mouth: Mucous membranes are moist.     Comments: Trace swelling of lower lip. Eyes:     General: No scleral icterus.       Right eye: No discharge.        Left eye: No discharge.     Extraocular Movements: Extraocular movements intact.     Conjunctiva/sclera: Conjunctivae normal.     Pupils: Pupils are equal, round, and reactive  to light.  Cardiovascular:     Rate and Rhythm: Normal rate and regular rhythm.     Pulses: Normal pulses.     Heart sounds: Normal heart sounds. No murmur heard.  No friction rub. No gallop.   Pulmonary:     Effort: Pulmonary effort is normal. No respiratory distress.     Breath sounds: Normal breath sounds. No stridor. No wheezing, rhonchi or rales.  Skin:    General: Skin is warm and dry.     Findings: Rash (Urticarial lesions over upper extremities, 1 over the right knee as depicted) present.  Neurological:     Mental Status: She is alert and oriented to person, place, and time.  Psychiatric:        Mood and Affect: Mood normal.        Behavior: Behavior normal.        Thought Content: Thought content normal.        Judgment: Judgment normal.               Assessment and Plan :   PDMP not reviewed this encounter.  1. Rash   2. Urticaria   3. Itching     IM Solu-Medrol, hydroxyzine at home.  Counseled on avoiding eating out, new exposures.  She has not started new medications, has not been exposed to any new hygiene products or poisonous plants.  Recommended keeping routine as normal as possible now that she is back home. Counseled patient on potential for adverse effects with medications prescribed/recommended today, ER and return-to-clinic precautions discussed, patient verbalized understanding.    Wallis Bamberg, [place] 03/22/20 1802

## 2020-03-22 NOTE — ED Triage Notes (Signed)
Pt reports she being eating in 3 different restaurants for the past 3 days and since yesterday her whole body is swelling, she think is related to the food she ate. Denies difficulty breathing/throat closing.

## 2020-03-22 NOTE — ED Triage Notes (Signed)
Patient arrived stating yesterday morning and began having an allergic reaction, reports having bilateral leg/feet swelling. Today started having swelling in her hands, wrists, and arms. States no new foods or products. No difficulty breathing. No significant swelling noted but skin has some redness and patient reports itching. She took a Benedryl with no relief.

## 2020-05-18 ENCOUNTER — Ambulatory Visit: Payer: Medicaid Other

## 2020-09-26 ENCOUNTER — Ambulatory Visit: Payer: Medicaid Other | Admitting: Obstetrics & Gynecology

## 2020-10-30 ENCOUNTER — Telehealth: Payer: Self-pay | Admitting: General Practice

## 2020-10-30 NOTE — Telephone Encounter (Signed)
lvm for patient to return call to get appointment scheduled from online request 

## 2020-11-02 ENCOUNTER — Ambulatory Visit (INDEPENDENT_AMBULATORY_CARE_PROVIDER_SITE_OTHER): Payer: Medicaid Other | Admitting: Podiatry

## 2020-11-02 ENCOUNTER — Other Ambulatory Visit: Payer: Self-pay

## 2020-11-02 DIAGNOSIS — M2142 Flat foot [pes planus] (acquired), left foot: Secondary | ICD-10-CM | POA: Diagnosis not present

## 2020-11-02 DIAGNOSIS — B351 Tinea unguium: Secondary | ICD-10-CM

## 2020-11-02 DIAGNOSIS — Z79899 Other long term (current) drug therapy: Secondary | ICD-10-CM | POA: Diagnosis not present

## 2020-11-02 DIAGNOSIS — M2141 Flat foot [pes planus] (acquired), right foot: Secondary | ICD-10-CM | POA: Diagnosis not present

## 2020-11-02 NOTE — Patient Instructions (Signed)
I have ordered a medication for you that will come from [facility] in [place]. They should be calling you to verify insurance and will mail the medication to you. If you live close by then you can go by their pharmacy to pick up the medication. Their phone number is [phone]. If you do not hear from them in the next few days, please give [place] a call at [phone].    Terbinafine oral granules What is this medicine? TERBINAFINE (TER bin a feen) is an antifungal medicine. It is used to treat certain kinds of fungal or yeast infections. This medicine may be used for other purposes; ask your health care provider or pharmacist if you have questions. COMMON BRAND NAME(S): Lamisil What should I tell my health care provider before I take this medicine? They need to know if you have any of these conditions:  drink alcoholic beverages  kidney disease  liver disease  an unusual or allergic reaction to Terbinafine, other medicines, foods, dyes, or preservatives  pregnant or trying to get pregnant  breast-feeding How should I use this medicine? Take this medicine by mouth. Follow the directions on the prescription label. Hold packet with cut line on top. Shake packet gently to settle contents. Tear packet open along cut line, or use scissors to cut across line. Carefully pour the entire contents of packet onto a spoonful of a soft food, such as pudding or other soft, non-acidic food such as mashed potatoes (do NOT use applesauce or a fruit-based food). If two packets are required for each dose, you may either sprinkle the content of both packets on one spoonful of non-acidic food, or sprinkle the contents of both packets on two spoonfuls of non-acidic food. Make sure that no granules remain in the packet. Swallow the mxiture of the food and granules without chewing. Take your medicine at regular intervals. Do not take it more often than directed. Take all of your medicine as directed even  if you think you are better. Do not skip doses or stop your medicine early. Contact your pediatrician or health care professional regarding the use of this medicine in children. While this medicine may be prescribed for children as young as 4 years for selected conditions, precautions do apply. Overdosage: If you think you have taken too much of this medicine contact a poison control center or emergency room at once. NOTE: This medicine is only for you. Do not share this medicine with others. What if I miss a dose? If you miss a dose, take it as soon as you can. If it is almost time for your next dose, take only that dose. Do not take double or extra doses. What may interact with this medicine? Do not take this medicine with any of the following medications:  thioridazine This medicine may also interact with the following medications:  beta-blockers  caffeine  cimetidine  cyclosporine  MAOIs like Carbex, Eldepryl, Marplan, Nardil, and Parnate  medicines for fungal infections like fluconazole and ketoconazole  medicines for irregular heartbeat like amiodarone, flecainide and propafenone  rifampin  SSRIs like citalopram, escitalopram, fluoxetine, fluvoxamine, paroxetine and sertraline  tricyclic antidepressants like amitriptyline, clomipramine, desipramine, imipramine, nortriptyline, and others  warfarin This list may not describe all possible interactions. Give your health care provider a list of all the medicines, herbs, non-prescription drugs, or dietary supplements you use. Also tell them if you smoke, drink alcohol, or use illegal drugs. Some items may interact with your medicine. What should I watch  for while using this medicine? Your doctor may monitor your liver function. Tell your doctor right away if you have nausea or vomiting, loss of appetite, stomach pain on your right upper side, yellow skin, dark urine, light stools, or are over tired. This medicine may cause serious  skin reactions. They can happen weeks to months after starting the medicine. Contact your health care provider right away if you notice fevers or flu-like symptoms with a rash. The rash may be red or purple and then turn into blisters or peeling of the skin. Or, you might notice a red rash with swelling of the face, lips or lymph nodes in your neck or under your arms. You need to take this medicine for 6 weeks or longer to cure the fungal infection. Take your medicine regularly for as long as your doctor or health care provider tells you to. What side effects may I notice from receiving this medicine? Side effects that you should report to your doctor or health care professional as soon as possible:  allergic reactions like skin rash or hives, swelling of the face, lips, or tongue  change in vision  dark urine  fever or infection  general ill feeling or flu-like symptoms  light-colored stools  loss of appetite, nausea  rash, fever, and swollen lymph nodes  redness, blistering, peeling or loosening of the skin, including inside the mouth  right upper belly pain  unusually weak or tired  yellowing of the eyes or skin Side effects that usually do not require medical attention (report to your doctor or health care professional if they continue or are bothersome):  changes in taste  diarrhea  hair loss  muscle or joint pain  stomach upset This list may not describe all possible side effects. Call your doctor for medical advice about side effects. You may report side effects to FDA at 1-800-FDA-1088. Where should I keep my medicine? Keep out of the reach of children. Store at room temperature between 15 and 30 degrees C (59 and 86 degrees F). Throw away any unused medicine after the expiration date. NOTE: This sheet is a summary. It may not cover all possible information. If you have questions about this medicine, talk to your doctor, pharmacist, or health care provider.  2021  Elsevier/Gold Standard (2018-12-04 15:35:11)

## 2020-11-03 ENCOUNTER — Other Ambulatory Visit: Payer: Self-pay | Admitting: Podiatry

## 2020-11-03 DIAGNOSIS — Z79899 Other long term (current) drug therapy: Secondary | ICD-10-CM

## 2020-11-03 LAB — HEPATIC FUNCTION PANEL
AG Ratio: 1.5 (calc) (ref 1.0–2.5)
ALT: 25 U/L (ref 6–29)
AST: 16 U/L (ref 10–30)
Albumin: 4 g/dL (ref 3.6–5.1)
Alkaline phosphatase (APISO): 65 U/L (ref 31–125)
Bilirubin, Direct: 0 mg/dL (ref 0.0–0.2)
Globulin: 2.7 g/dL (calc) (ref 1.9–3.7)
Indirect Bilirubin: 0.2 mg/dL (calc) (ref 0.2–1.2)
Total Bilirubin: 0.2 mg/dL (ref 0.2–1.2)
Total Protein: 6.7 g/dL (ref 6.1–8.1)

## 2020-11-03 LAB — CBC WITH DIFFERENTIAL/PLATELET
Absolute Monocytes: 538 cells/uL (ref 200–950)
Basophils Absolute: 28 cells/uL (ref 0–200)
Basophils Relative: 0.4 %
Eosinophils Absolute: 83 cells/uL (ref 15–500)
Eosinophils Relative: 1.2 %
HCT: 39.6 % (ref 35.0–45.0)
Hemoglobin: 13 g/dL (ref 11.7–15.5)
Lymphs Abs: 2691 cells/uL (ref 850–3900)
MCH: 27.3 pg (ref 27.0–33.0)
MCHC: 32.8 g/dL (ref 32.0–36.0)
MCV: 83.2 fL (ref 80.0–100.0)
MPV: 10.7 fL (ref 7.5–12.5)
Monocytes Relative: 7.8 %
Neutro Abs: 3560 cells/uL (ref 1500–7800)
Neutrophils Relative %: 51.6 %
Platelets: 337 10*3/uL (ref 140–400)
RBC: 4.76 10*6/uL (ref 3.80–5.10)
RDW: 12.9 % (ref 11.0–15.0)
Total Lymphocyte: 39 %
WBC: 6.9 10*3/uL (ref 3.8–10.8)

## 2020-11-03 MED ORDER — TERBINAFINE HCL 250 MG PO TABS: 250.0000 mg | ORAL_TABLET | Freq: Every day | ORAL | 0 refills | Status: DC

## 2020-11-05 NOTE — Progress Notes (Signed)
Subjective:   Patient ID: [name], female   DOB: 25 y.o.   MRN: [id number]   HPI 25 year old female presents the office today for concerns of her toenails becoming thickened discolored.  She states that occasionally, uncomfortable and she gets pedicures on a regular basis.  The discoloration and thickening have been ongoing for about 2 years.  She states that the nails on the big nails have, after injuries about a year and a half ago.   Review of Systems  All other systems reviewed and are negative.  Past Medical History:  Diagnosis Date  . Medical history non-contributory     Past Surgical History:  Procedure Laterality Date  . DILATION AND CURETTAGE OF UTERUS       Current Outpatient Medications:  .  amLODipine (NORVASC) 5 MG tablet, Take 1 tablet (5 mg total) by mouth daily. (Patient not taking: Reported on 11/12/2017), Disp: 30 tablet, Rfl: 3 .  etonogestrel-ethinyl estradiol (NUVARING) 0.12-0.015 MG/24HR vaginal ring, Insert vaginally and leave in place for 3 consecutive weeks, then remove for 1 week., Disp: 1 each, Rfl: 12 .  hydrOXYzine (ATARAX/VISTARIL) 25 MG tablet, Take 0.5-1 tablets (12.5-25 mg total) by mouth every 8 (eight) hours as needed for itching., Disp: 30 tablet, Rfl: 0 .  ibuprofen (ADVIL,MOTRIN) 600 MG tablet, Take 1 tablet (600 mg total) by mouth every 6 (six) hours. (Patient not taking: Reported on 11/12/2017), Disp: 30 tablet, Rfl: 0 .  iron polysaccharides (NIFEREX) 150 MG capsule, Take 1 capsule (150 mg total) by mouth 2 (two) times daily. (Patient not taking: Reported on 11/12/2017), Disp: 60 capsule, Rfl: 3 .  metroNIDAZOLE (FLAGYL) 500 MG tablet, Take 1 tablet (500 mg total) by mouth 2 (two) times daily., Disp: 14 tablet, Rfl: 0 .  nitrofurantoin, macrocrystal-monohydrate, (MACROBID) 100 MG capsule, Take 1 capsule (100 mg total) by mouth 2 (two) times daily. (Patient not taking: Reported on 11/17/2018), Disp: 14 capsule, Rfl: 0 .  omeprazole (PRILOSEC) 20 MG  capsule, Take 1 capsule (20 mg total) by mouth daily. (Patient not taking: Reported on 11/12/2017), Disp: 20 capsule, Rfl: 0 .  Prenat-FeAsp-Meth-FA-DHA w/o A (PRENATE PIXIE) 10-0.6-0.4-200 MG CAPS, Take 1 tablet by mouth daily. (Patient not taking: Reported on 11/12/2017), Disp: 30 capsule, Rfl: 12 .  Prenatal-Fe Fum-Methf-FA w/o A (VITAFOL-NANO) 18-0.6-0.4 MG TABS, Take 1 tablet by mouth daily. (Patient not taking: Reported on 11/17/2018), Disp: 30 tablet, Rfl: 12 .  terbinafine (LAMISIL) 250 MG tablet, Take 1 tablet (250 mg total) by mouth daily., Disp: 90 tablet, Rfl: 0 .  terconazole (TERAZOL 3) 0.8 % vaginal cream, Place 1 applicator vaginally at bedtime., Disp: 20 g, Rfl: 0  No Known Allergies        Objective:  Physical Exam  General: AAO x3, NAD  Dermatological: Nails appear to be hypertrophic, dystrophic with yellow-brown discoloration. No pain in the nails.  There is no edema, erythema.  Vascular: Dorsalis Pedis artery and Posterior Tibial artery pedal pulses are 2/4 bilateral with immedate capillary fill time. There is no pain with calf compression, swelling, warmth, erythema.   Neruologic: Grossly intact via light touch bilateral.  Musculoskeletal: There is a decrease in medial arch upon weightbearing with flatfoot evident.  Ankle, subtalar joint range of motion intact but any restrictions.  Muscular strength 5/5 in all groups tested bilateral.  Gait: Unassisted, Nonantalgic.       Assessment:   Onychomycosis, flatfoot     Plan:  -Treatment options discussed including all alternatives, risks, and complications -  Etiology of symptoms were discussed -Regards the nail fungus discussed multiple treatment options.  She has been to oral and topical discussed side effects as well as success rates.  We will check a CBC and LFT prior to starting Lamisil.  Order compound cream today through [facility] for nail fungus. 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  Discussed general good arch support and stiffer shoes to help with this and help prevent any future issues.  Vivi Barrack DPM

## 2020-11-21 ENCOUNTER — Emergency Department (HOSPITAL_COMMUNITY)
Admission: EM | Admit: 2020-11-21 | Discharge: 2020-11-21 | Disposition: A | Discharge status: Home / Self Care | Payer: Medicaid Other | Attending: Emergency Medicine | Admitting: Emergency Medicine

## 2020-11-21 ENCOUNTER — Encounter (HOSPITAL_COMMUNITY): Payer: Self-pay

## 2020-11-21 ENCOUNTER — Other Ambulatory Visit: Payer: Self-pay

## 2020-11-21 ENCOUNTER — Emergency Department (HOSPITAL_COMMUNITY): Payer: Medicaid Other

## 2020-11-21 DIAGNOSIS — N939 Abnormal uterine and vaginal bleeding, unspecified: Secondary | ICD-10-CM

## 2020-11-21 DIAGNOSIS — R188 Other ascites: Secondary | ICD-10-CM | POA: Diagnosis not present

## 2020-11-21 DIAGNOSIS — R1032 Left lower quadrant pain: Secondary | ICD-10-CM | POA: Diagnosis present

## 2020-11-21 LAB — CBC
HCT: 43 % (ref 36.0–46.0)
Hemoglobin: 13.7 g/dL (ref 12.0–15.0)
MCH: 27.6 pg (ref 26.0–34.0)
MCHC: 31.9 g/dL (ref 30.0–36.0)
MCV: 86.7 fL (ref 80.0–100.0)
Platelets: 366 10*3/uL (ref 150–400)
RBC: 4.96 MIL/uL (ref 3.87–5.11)
RDW: 13.8 % (ref 11.5–15.5)
WBC: 6.5 10*3/uL (ref 4.0–10.5)
nRBC: 0 % (ref 0.0–0.2)

## 2020-11-21 LAB — COMPREHENSIVE METABOLIC PANEL
ALT: 29 U/L (ref 0–44)
AST: 19 U/L (ref 15–41)
Albumin: 4.2 g/dL (ref 3.5–5.0)
Alkaline Phosphatase: 69 U/L (ref 38–126)
Anion gap: 9 (ref 5–15)
BUN: 13 mg/dL (ref 6–20)
CO2: 25 mmol/L (ref 22–32)
Calcium: 9.4 mg/dL (ref 8.9–10.3)
Chloride: 103 mmol/L (ref 98–111)
Creatinine, Ser: 0.93 mg/dL (ref 0.44–1.00)
GFR, Estimated: >60 mL/min (ref >60)
Glucose, Bld: 123 mg/dL — ABNORMAL HIGH (ref 70–99)
Potassium: 3.9 mmol/L (ref 3.5–5.1)
Sodium: 137 mmol/L (ref 135–145)
Total Bilirubin: 0.6 mg/dL (ref 0.3–1.2)
Total Protein: 7.9 g/dL (ref 6.5–8.1)

## 2020-11-21 LAB — WET PREP, GENITAL
Clue Cells Wet Prep HPF POC: NONE SEEN
Sperm: NONE SEEN
Trich, Wet Prep: NONE SEEN
Yeast Wet Prep HPF POC: NONE SEEN

## 2020-11-21 LAB — URINALYSIS, ROUTINE W REFLEX MICROSCOPIC
Bacteria, UA: NONE SEEN
Bilirubin Urine: NEGATIVE
Glucose, UA: NEGATIVE mg/dL
Ketones, ur: NEGATIVE mg/dL
Leukocytes,Ua: NEGATIVE
Nitrite: NEGATIVE
Protein, ur: 30 mg/dL — AB
RBC / HPF: >50 RBC/hpf — ABNORMAL HIGH (ref 0–5)
Specific Gravity, Urine: 1.018 (ref 1.005–1.030)
pH: 6 (ref 5.0–8.0)

## 2020-11-21 LAB — I-STAT BETA HCG BLOOD, ED (MC, WL, AP ONLY): I-stat hCG, quantitative: <5 m[IU]/mL (ref <5)

## 2020-11-21 LAB — LIPASE, BLOOD: Lipase: 26 U/L (ref 11–51)

## 2020-11-21 MED ORDER — MEGESTROL ACETATE 40 MG PO TABS: 40.0000 mg | ORAL_TABLET | Freq: Every day | ORAL | 0 refills | Status: AC

## 2020-11-21 NOTE — ED Triage Notes (Signed)
Patient reports that she has had heavy vaginal bleeding with clots and lower abdominal pain x 1 month. Patient states the bleeding stopped for a few days, but has been consistent x 3 weeks.

## 2020-11-21 NOTE — Discharge Instructions (Signed)
You were seen today for vaginal bleeding, your work-up was reassuring.  You most likely have abnormal uterine bleeding, see the attachments to learn more about this.  I want you to take the Megace for the next 5 days, please see your OB/GYN as we spoke about.  I want you to schedule appointment with them as soon as possible.  I also want you to keep an eye on your blood pressure, as we discussed it was elevated today.  Your STD results will come up on MyChart in the next 2 days, please keep an eye on these as well.  Please speak to your pharmacist about any new medications prescribed in regards to side effects or interactions with other medications  .Get help right away if you: Pass out. Have bleeding that soaks through a pad every hour. Have pain in the abdomen. Have a fever or chills. Become sweaty or weak. Pass large blood clots from your vagina.

## 2020-11-21 NOTE — ED Provider Notes (Signed)
[facility]-EMERGENCY DEPT Provider Note   CSN: [id number] Arrival date & time: 11/21/20  1624     History Chief Complaint  Patient presents with  . Vaginal Bleeding  . Abdominal Pain    [name] is a 25 y.o. female with past medical history of D&C in the presents emerge department today for heavy vaginal bleeding with clots and lower abdominal pain for the past month.  Patient states that she started having abdominal cramping with associated vaginal bleeding with intermittent clots since the beginning of February, persisted for 2 weeks and then completely stopped for about 3 days.  States that it started again and has been persistent for the past 3 weeks.  States that she goes to her about 5 pads a day.  States that she has associated clots with bleeding.  States that she was on the NuvaRing however discontinued this 3 months ago.  Is not on any birth control now.  Patient states prior to this she had a normal menstrual cycle, her last menstrual cycle was in January.  Denies any syncope, dizziness, fevers, nausea, vomiting.  States that she does have some associated abdominal cramping, feels exactly like a menstrual cycle.  States that she took some Motrin before she came here and her abdominal cramping has subsided.  Denies any chance of STDs, states that the last time she had something similar to this she had a miscarriage, resulting in a D&C, states that this was in 2015.  Denies any other vaginal surgeries.  States that she was in her normal health before this.  Denies any vaginal itching, vaginal discharge, pelvic pain.  Denies any dysuria.  HPI     Past Medical History:  Diagnosis Date  . Medical history non-contributory     There are no problems to display for this patient.   Past Surgical History:  Procedure Laterality Date  . DILATION AND CURETTAGE OF UTERUS       OB History    Gravida  2   Para  1   Term  1   Preterm      AB  1    Living  1     SAB  1   IAB      Ectopic      Multiple  0   Live Births  1           Family History  Problem Relation Age of Onset  . Diabetes Paternal Grandmother   . Hypertension Paternal Grandmother   . Hypertension Mother   . Diabetes Mother   . Sleep apnea Mother   . Hypertension Father   . Diabetes Father   . Sleep apnea Father     Social History   Tobacco Use  . Smoking status: Never Smoker  . Smokeless tobacco: Never Used  Vaping Use  . Vaping Use: Never used  Substance Use Topics  . Alcohol use: No    Alcohol/week: 0.0 standard drinks  . Drug use: No    Home Medications Prior to Admission medications   Medication Sig Start Date End Date Taking? Authorizing Provider  ibuprofen (ADVIL,MOTRIN) 600 MG tablet Take 1 tablet (600 mg total) by mouth every 6 (six) hours. Patient taking differently: Take 600 mg by mouth every 6 (six) hours as needed for moderate pain or cramping. 07/08/16  Yes Willodean RosenthalHarraway-Smith, Carolyn, MD  megestrol (MEGACE) 40 MG tablet Take 1 tablet (40 mg total) by mouth daily for 5 days. 11/21/20 11/26/20 Yes Khamauri Bauernfeind,  Laniyah Rosenwald, PA-C  terbinafine (LAMISIL) 250 MG tablet Take 1 tablet (250 mg total) by mouth daily. 11/03/20  Yes Vivi Barrack, DPM  amLODipine (NORVASC) 5 MG tablet Take 1 tablet (5 mg total) by mouth daily. Patient not taking: No sig reported 07/08/16   Willodean Rosenthal, MD  etonogestrel-ethinyl estradiol (NUVARING) 0.12-0.015 MG/24HR vaginal ring Insert vaginally and leave in place for 3 consecutive weeks, then remove for 1 week. Patient not taking: No sig reported 11/17/18   Gerrit Heck, CNM  hydrOXYzine (ATARAX/VISTARIL) 25 MG tablet Take 0.5-1 tablets (12.5-25 mg total) by mouth every 8 (eight) hours as needed for itching. Patient not taking: No sig reported 03/22/20   Wallis Bamberg, PA-C  iron polysaccharides (NIFEREX) 150 MG capsule Take 1 capsule (150 mg total) by mouth 2 (two) times daily. Patient not taking: No sig  reported 07/08/16   Willodean Rosenthal, MD  metroNIDAZOLE (FLAGYL) 500 MG tablet Take 1 tablet (500 mg total) by mouth 2 (two) times daily. Patient not taking: No sig reported 11/18/17   Orvilla Cornwall A, CNM  nitrofurantoin, macrocrystal-monohydrate, (MACROBID) 100 MG capsule Take 1 capsule (100 mg total) by mouth 2 (two) times daily. Patient not taking: No sig reported 11/12/17   Orvilla Cornwall A, CNM  omeprazole (PRILOSEC) 20 MG capsule Take 1 capsule (20 mg total) by mouth daily. Patient not taking: No sig reported 10/13/17   Elpidio Anis, PA-C  Prenat-FeAsp-Meth-FA-DHA w/o A (PRENATE PIXIE) 10-0.6-0.4-200 MG CAPS Take 1 tablet by mouth daily. Patient not taking: No sig reported 04/25/16   Orvilla Cornwall A, CNM  Prenatal-Fe Fum-Methf-FA w/o A (VITAFOL-NANO) 18-0.6-0.4 MG TABS Take 1 tablet by mouth daily. Patient not taking: No sig reported 11/12/17   Orvilla Cornwall A, CNM  terconazole (TERAZOL 3) 0.8 % vaginal cream Place 1 applicator vaginally at bedtime. Patient not taking: Reported on 11/21/2020 11/18/17   Roe Coombs, CNM    Allergies    Patient has no known allergies.  Review of Systems   Review of Systems  Constitutional: Negative for chills, diaphoresis, fatigue and fever.  HENT: Negative for congestion, sore throat and trouble swallowing.   Eyes: Negative for pain and visual disturbance.  Respiratory: Negative for cough, shortness of breath and wheezing.   Cardiovascular: Negative for chest pain, palpitations and leg swelling.  Gastrointestinal: Positive for abdominal pain. Negative for abdominal distention, diarrhea, nausea and vomiting.  Genitourinary: Positive for menstrual problem and vaginal bleeding. Negative for decreased urine volume, difficulty urinating, dyspareunia, dysuria, flank pain, frequency, genital sores, hematuria, pelvic pain, urgency, vaginal discharge and vaginal pain.  Musculoskeletal: Negative for back pain, neck pain and neck stiffness.   Skin: Negative for pallor.  Neurological: Negative for dizziness, speech difficulty, weakness and headaches.  Psychiatric/Behavioral: Negative for confusion.    Physical Exam Updated Vital Signs BP (!) 153/94 (BP Location: Left Arm)   Pulse 89   Temp 98.5 F (36.9 C) (Oral)   Resp 17   Ht 5\' 5"  (1.651 m)   Wt 124.7 kg   LMP 11/21/2020   SpO2 98%   BMI 45.76 kg/m   Physical Exam Constitutional:      General: She is not in acute distress.    Appearance: Normal appearance. She is not ill-appearing, toxic-appearing or diaphoretic.  HENT:     Mouth/Throat:     Mouth: Mucous membranes are moist.     Pharynx: Oropharynx is clear.  Eyes:     General: No scleral icterus.    Extraocular Movements: Extraocular movements  intact.     Pupils: Pupils are equal, round, and reactive to light.  Cardiovascular:     Rate and Rhythm: Normal rate and regular rhythm.     Pulses: Normal pulses.     Heart sounds: Normal heart sounds.  Pulmonary:     Effort: Pulmonary effort is normal. No respiratory distress.     Breath sounds: Normal breath sounds. No stridor. No wheezing, rhonchi or rales.  Chest:     Chest wall: No tenderness.  Abdominal:     General: Abdomen is flat. There is no distension.     Palpations: Abdomen is soft.     Tenderness: There is no abdominal tenderness. There is no guarding or rebound.  Genitourinary:    Comments: Chaperone present.  External vaginal exam benign, internal vaginal exam with moderate amount of blood in vaginal vault, cervix is closed, nontender.  No discharge.  No lesions.  Bimanual exam without any tenderness, no adnexal cervical motion tenderness. Musculoskeletal:        General: No swelling or tenderness. Normal range of motion.     Cervical back: Normal range of motion and neck supple. No rigidity.     Right lower leg: No edema.     Left lower leg: No edema.  Skin:    General: Skin is warm and dry.     Capillary Refill: Capillary refill takes  less than 2 seconds.     Coloration: Skin is not pale.  Neurological:     General: No focal deficit present.     Mental Status: She is alert and oriented to person, place, and time.  Psychiatric:        Mood and Affect: Mood normal.        Behavior: Behavior normal.     ED Results / Procedures / Treatments   Labs (all labs ordered are listed, but only abnormal results are displayed) Labs Reviewed  WET PREP, GENITAL - Abnormal; Notable for the following components:      Result Value   WBC, Wet Prep HPF POC MODERATE (*)    All other components within normal limits  COMPREHENSIVE METABOLIC PANEL - Abnormal; Notable for the following components:   Glucose, Bld 123 (*)    All other components within normal limits  URINALYSIS, ROUTINE W REFLEX MICROSCOPIC - Abnormal; Notable for the following components:   APPearance CLOUDY (*)    Hgb urine dipstick LARGE (*)    Protein, ur 30 (*)    RBC / HPF >50 (*)    All other components within normal limits  LIPASE, BLOOD  CBC  I-STAT BETA HCG BLOOD, ED ([facility], [facility], [facility] ONLY)  GC/CHLAMYDIA PROBE AMP ([facility]) NOT AT [facility]    EKG None  Radiology US Transvaginal Non-OB  Result Date: 11/21/2020 CLINICAL DATA:  Vaginal bleeding. EXAM: TRANSABDOMINAL AND TRANSVAGINAL ULTRASOUND OF PELVIS TECHNIQUE: Both transabdominal and transvaginal ultrasound examinations of the pelvis were performed. Transabdominal technique was performed for global imaging of the pelvis including uterus, ovaries, adnexal regions, and pelvic cul-de-sac. It was necessary to proceed with endovaginal exam following the transabdominal exam to visualize the uterus, endometrium, bilateral ovaries and bilateral adnexa. COMPARISON:  None FINDINGS: Uterus Measurements: 8.2 cm x 3.4 cm x 4.8 cm = volume: 69.0 mL. No fibroids or other mass visualized. Endometrium Thickness: 5.4 mm.  No focal abnormality visualized. Right ovary Measurements: 3.8 cm x 2.7 cm x 2.8 cm = volume: 14.8 mL. Normal  appearance/no adnexal mass. Left ovary Measurements: 3.5 cm  x 2.2 cm x 2.6 cm = volume: 10.4 mL. Normal appearance/no adnexal mass. Other findings A trace amount of pelvic free fluid is seen. IMPRESSION: 1. Trace amount of pelvic free fluid which may be physiologic in nature. 2. Otherwise, normal pelvic ultrasound. Electronically Signed   By: Aram Candela M.D.   On: 11/21/2020 20:57   US Pelvis Complete  Result Date: 11/21/2020 CLINICAL DATA:  Vaginal bleeding x3 weeks. EXAM: TRANSABDOMINAL AND TRANSVAGINAL ULTRASOUND OF PELVIS TECHNIQUE: Both transabdominal and transvaginal ultrasound examinations of the pelvis were performed. Transabdominal technique was performed for global imaging of the pelvis including uterus, ovaries, adnexal regions, and pelvic cul-de-sac. It was necessary to proceed with endovaginal exam following the transabdominal exam to visualize the uterus, endometrium, bilateral ovaries and bilateral adnexa. COMPARISON:  None FINDINGS: Uterus Measurements: 8.2 cm x 3.4 cm x 4.8 cm = volume: 69.0 mL. No fibroids or other mass visualized. Endometrium Thickness: 5.4 mm.  No focal abnormality visualized. Right ovary Measurements: 3.8 cm x 2.7 cm x 2.8 cm = volume: 14.8 mL. Normal appearance/no adnexal mass. Left ovary Measurements: 3.5 cm x 2.2 cm x 2.6 cm = volume: 10.4 mL. Normal appearance/no adnexal mass. Other findings A trace amount of pelvic free fluid is seen. IMPRESSION: 1. Trace amount of pelvic free fluid which may be physiologic in nature. 2. Otherwise, normal pelvic ultrasound. Electronically Signed   By: Aram Candela M.D.   On: 11/21/2020 20:55    Procedures Procedures   Medications Ordered in ED Medications - No data to display  ED Course  I have reviewed the triage vital signs and the nursing notes.  Pertinent labs & imaging results that were available during my care of the patient were reviewed by me and considered in my medical decision making (see chart for  details).    MDM Rules/Calculators/A&P                         Cheryln Balcom is a 25 y.o. female with past medical history of D&C that presents to the emergency department today for heavy vaginal bleeding with clots and lower abdominal pain for the past month.  Pelvic exam with moderate amount of blood.  Patient is a monogamous relationship, no concerns for PID, no cervical motion tenderness.  Vitals are stable, blood pressure slightly elevated, patient states that it is normally elevated when she comes to the emergency department.  No abdominal tenderness.  Patient is stable, hemoglobin 13.7, CBC and CMP unremarkable.  Urinalysis with blood, most likely from menstrual cycle.  No signs of infection.  Patient hCG less than 5.  Ultrasound without any acute findings.  Will have patient follow-up with OB, she does follow an OB/GYN, states that she will get an appointment tomorrow.  Will place on Megace in the meantime, did discuss that patient should discuss this with OB/GYN if she has an appointment tomorrow.  Discussed dosing with pharmacy.  Doubt need for further emergent work up at this time. I explained the diagnosis and have given explicit precautions to return to the ER including for any other new or worsening symptoms. The patient understands and accepts the medical plan as it's been dictated and I have answered their questions. Discharge instructions concerning home care and prescriptions have been given. The patient is STABLE and is discharged to home in good condition.  I discussed this case with my attending physician who cosigned this note including patient's presenting symptoms, physical exam, and planned  diagnostics and interventions. Attending physician stated agreement with plan or made changes to plan which were implemented.    Final Clinical Impression(s) / ED Diagnoses Final diagnoses:  Abnormal uterine bleeding    Rx / DC Orders ED Discharge Orders         Ordered     megestrol (MEGACE) 40 MG tablet  Daily        11/21/20 2152           Farrel Gordon, PA-C 11/21/20 2207    Benjiman Core, MD 11/21/20 2257

## 2020-11-23 ENCOUNTER — Telehealth: Payer: Self-pay

## 2020-11-23 LAB — GC/CHLAMYDIA PROBE AMP (~~LOC~~) NOT AT ARMC
Chlamydia: NEGATIVE
Comment: NEGATIVE
Comment: NORMAL
Neisseria Gonorrhea: NEGATIVE

## 2020-11-23 NOTE — Telephone Encounter (Signed)
Transition Care Management Unsuccessful Follow-up Telephone Call  Date of discharge and from where:  11/21/2020 from [facility]  Attempts:  1st Attempt  Reason for unsuccessful TCM follow-up call:  Left voice message

## 2020-11-24 NOTE — Telephone Encounter (Signed)
Transition Care Management Unsuccessful Follow-up Telephone Call  Date of discharge and from where:  11/21/2020 from [facility]  Attempts:  2nd Attempt  Reason for unsuccessful TCM follow-up call:  Left voice message

## 2020-11-27 NOTE — Telephone Encounter (Signed)
Transition Care Management Follow-up Telephone Call  Date of discharge and from where: 11/21/2020 from [facility]  How have you been since you were released from the hospital? Pt states that the bleeding has stopped and pt is feeling better. Pt has no questions or concerns at this time.   Any questions or concerns? No  Items Reviewed:  Did the pt receive and understand the discharge instructions provided? Yes   Medications obtained and verified? Yes   Other? No   Any new allergies since your discharge? No   Dietary orders reviewed? n/a  Do you have support at home? Yes   Functional Questionnaire: (I = Independent and D = Dependent) ADLs: I  Bathing/Dressing- I  Meal Prep- I  Eating- I  Maintaining continence- I  Transferring/Ambulation- I  Managing Meds- I   Follow up appointments reviewed:   [facility] f/u appt confirmed? No  Scheduled to see Leroy Libman, MD on 11/28/2020 @ 03:45pm.  Are transportation arrangements needed? No   If their condition worsens, is the pt aware to call PCP or go to the Emergency Dept.? Yes  Was the patient provided with contact information for the PCP's office or ED? Yes  Was to pt encouraged to call back with questions or concerns? Yes

## 2020-11-28 ENCOUNTER — Ambulatory Visit (INDEPENDENT_AMBULATORY_CARE_PROVIDER_SITE_OTHER): Payer: Medicaid Other | Admitting: Obstetrics and Gynecology

## 2020-11-28 ENCOUNTER — Other Ambulatory Visit: Payer: Self-pay

## 2020-11-28 ENCOUNTER — Encounter: Payer: Self-pay | Admitting: Obstetrics and Gynecology

## 2020-11-28 VITALS — BP 138/84 | HR 89 | Wt 304.0 lb

## 2020-11-28 DIAGNOSIS — Z3009 Encounter for other general counseling and advice on contraception: Secondary | ICD-10-CM

## 2020-11-28 NOTE — Progress Notes (Signed)
GYNECOLOGY OFFICE NOTE  History:  25 y.o. G2P1011 here today for f/u from ED. Had heavy vaginal bleeding and cramping pain. Stopped taking Nuvaring several months ago and would like to restart. ED started megace which stopped her bleeding within a few days.    Past Medical History:  Diagnosis Date  . Medical history non-contributory     Past Surgical History:  Procedure Laterality Date  . DILATION AND CURETTAGE OF UTERUS       Current Outpatient Medications:  .  megestrol (MEGACE) 40 MG tablet, Take 40 mg by mouth daily., Disp: , Rfl:  .  etonogestrel-ethinyl estradiol (NUVARING) 0.12-0.015 MG/24HR vaginal ring, Insert vaginally and leave in place for 3 consecutive weeks, then remove for 1 week. (Patient not taking: No sig reported), Disp: 1 each, Rfl: 12 .  hydrOXYzine (ATARAX/VISTARIL) 25 MG tablet, Take 0.5-1 tablets (12.5-25 mg total) by mouth every 8 (eight) hours as needed for itching. (Patient not taking: No sig reported), Disp: 30 tablet, Rfl: 0 .  ibuprofen (ADVIL,MOTRIN) 600 MG tablet, Take 1 tablet (600 mg total) by mouth every 6 (six) hours. (Patient not taking: Reported on 11/28/2020), Disp: 30 tablet, Rfl: 0 .  iron polysaccharides (NIFEREX) 150 MG capsule, Take 1 capsule (150 mg total) by mouth 2 (two) times daily. (Patient not taking: No sig reported), Disp: 60 capsule, Rfl: 3 .  Multiple Vitamin (MULTI-VITAMIN) tablet, Take 1 tablet by mouth daily. (Patient not taking: Reported on 11/28/2020), Disp: , Rfl:  .  omeprazole (PRILOSEC) 20 MG capsule, Take 1 capsule (20 mg total) by mouth daily. (Patient not taking: No sig reported), Disp: 20 capsule, Rfl: 0 .  Prenat-FeAsp-Meth-FA-DHA w/o A (PRENATE PIXIE) 10-0.6-0.4-200 MG CAPS, Take 1 tablet by mouth daily. (Patient not taking: No sig reported), Disp: 30 capsule, Rfl: 12 .  Prenatal-Fe Fum-Methf-FA w/o A (VITAFOL-NANO) 18-0.6-0.4 MG TABS, Take 1 tablet by mouth daily. (Patient not taking: No sig reported), Disp: 30 tablet,  Rfl: 12 .  terbinafine (LAMISIL) 250 MG tablet, Take 1 tablet (250 mg total) by mouth daily. (Patient not taking: Reported on 11/28/2020), Disp: 90 tablet, Rfl: 0 .  terconazole (TERAZOL 3) 0.8 % vaginal cream, Place 1 applicator vaginally at bedtime. (Patient not taking: Reported on 11/21/2020), Disp: 20 g, Rfl: 0  The following portions of the patient's history were reviewed and updated as appropriate: allergies, current medications, past family history, past medical history, past social history, past surgical history and problem list.   Review of Systems:  Pertinent items noted in HPI and remainder of comprehensive ROS otherwise negative.   Objective:  Physical Exam BP 138/84   Pulse 89   Wt (!) 304 lb (137.9 kg)   LMP 11/21/2020   BMI 50.59 kg/m  CONSTITUTIONAL: Well-developed, well-nourished female in no acute distress.  HENT:  Normocephalic, atraumatic. External right and left ear normal. Oropharynx is clear and moist EYES: Conjunctivae and EOM are normal. Pupils are equal, round, and reactive to light. No scleral icterus.  NECK: Normal range of motion, supple, no masses SKIN: Skin is warm and dry. No rash noted. Not diaphoretic. No erythema. No pallor. NEUROLOGIC: Alert and oriented to person, place, and time. Normal reflexes, muscle tone coordination. No cranial nerve deficit noted. PSYCHIATRIC: Normal mood and affect. Normal behavior. Normal judgment and thought content. CARDIOVASCULAR: Normal heart rate noted RESPIRATORY: Effort normal, no problems with respiration noted ABDOMEN: Soft, no distention noted.   PELVIC: deferred MUSCULOSKELETAL: Normal range of motion. No edema noted.  Labs and  Imaging US Transvaginal Non-OB  Result Date: 11/21/2020 CLINICAL DATA:  Vaginal bleeding. EXAM: TRANSABDOMINAL AND TRANSVAGINAL ULTRASOUND OF PELVIS TECHNIQUE: Both transabdominal and transvaginal ultrasound examinations of the pelvis were performed. Transabdominal technique was performed  for global imaging of the pelvis including uterus, ovaries, adnexal regions, and pelvic cul-de-sac. It was necessary to proceed with endovaginal exam following the transabdominal exam to visualize the uterus, endometrium, bilateral ovaries and bilateral adnexa. COMPARISON:  None FINDINGS: Uterus Measurements: 8.2 cm x 3.4 cm x 4.8 cm = volume: 69.0 mL. No fibroids or other mass visualized. Endometrium Thickness: 5.4 mm.  No focal abnormality visualized. Right ovary Measurements: 3.8 cm x 2.7 cm x 2.8 cm = volume: 14.8 mL. Normal appearance/no adnexal mass. Left ovary Measurements: 3.5 cm x 2.2 cm x 2.6 cm = volume: 10.4 mL. Normal appearance/no adnexal mass. Other findings A trace amount of pelvic free fluid is seen. IMPRESSION: 1. Trace amount of pelvic free fluid which may be physiologic in nature. 2. Otherwise, normal pelvic ultrasound. Electronically Signed   By: Aram Candela M.D.   On: 11/21/2020 20:57   US Pelvis Complete  Result Date: 11/21/2020 CLINICAL DATA:  Vaginal bleeding x3 weeks. EXAM: TRANSABDOMINAL AND TRANSVAGINAL ULTRASOUND OF PELVIS TECHNIQUE: Both transabdominal and transvaginal ultrasound examinations of the pelvis were performed. Transabdominal technique was performed for global imaging of the pelvis including uterus, ovaries, adnexal regions, and pelvic cul-de-sac. It was necessary to proceed with endovaginal exam following the transabdominal exam to visualize the uterus, endometrium, bilateral ovaries and bilateral adnexa. COMPARISON:  None FINDINGS: Uterus Measurements: 8.2 cm x 3.4 cm x 4.8 cm = volume: 69.0 mL. No fibroids or other mass visualized. Endometrium Thickness: 5.4 mm.  No focal abnormality visualized. Right ovary Measurements: 3.8 cm x 2.7 cm x 2.8 cm = volume: 14.8 mL. Normal appearance/no adnexal mass. Left ovary Measurements: 3.5 cm x 2.2 cm x 2.6 cm = volume: 10.4 mL. Normal appearance/no adnexal mass. Other findings A trace amount of pelvic free fluid is seen.  IMPRESSION: 1. Trace amount of pelvic free fluid which may be physiologic in nature. 2. Otherwise, normal pelvic ultrasound. Electronically Signed   By: Aram Candela M.D.   On: 11/21/2020 20:55    Assessment & Plan:  1. Encounter for counseling regarding contraception - Pt requesting to restart nuvaring, reviewed risks of nuvaring with elevated BP (patient had post partum hypertension, prescribed meds but did not take them), elevated BP in ED but normal range here - reviewed other options for contraception including IUD in detail - she would like IUD but will return for pap and IUD for another visit   Routine preventative health maintenance measures emphasized. Please refer to After Visit Summary for other counseling recommendations.   Return in about 1 week (around 12/05/2020) for in person, Followup for pap and IUD insertion.  I have spent a total of 18 minutes of face-to-face and non-face-to-face time, excluding clinical staff time, reviewing notes and preparing to see patient, ordering tests and/or medications, and counseling the patient.   Baldemar Lenis, MD, [facility] Attending Center for [employer] [facility])

## 2020-11-28 NOTE — Progress Notes (Signed)
RGYN patient presents for F/U from ER as advised.  Seen 11/21/20 for AUB /Abdominal pain  was given Megace by ER. Pt states Rx has since stopped bleeding. Contraception: None at this time stopped Nuvaring 3 months ago. wants to discuss starting back on NuvaRing. *Last had unprotected intercourse 09/27/2020.   Last AEX 11/17/2018. Last pap: 11/12/2017.

## 2020-12-13 ENCOUNTER — Other Ambulatory Visit: Payer: Self-pay

## 2020-12-13 ENCOUNTER — Ambulatory Visit (INDEPENDENT_AMBULATORY_CARE_PROVIDER_SITE_OTHER): Payer: Medicaid Other | Admitting: Podiatry

## 2020-12-13 DIAGNOSIS — B351 Tinea unguium: Secondary | ICD-10-CM

## 2020-12-13 DIAGNOSIS — M79676 Pain in unspecified toe(s): Secondary | ICD-10-CM

## 2020-12-16 ENCOUNTER — Encounter (HOSPITAL_COMMUNITY): Payer: Self-pay | Admitting: *Deleted

## 2020-12-16 ENCOUNTER — Other Ambulatory Visit: Payer: Self-pay

## 2020-12-16 ENCOUNTER — Ambulatory Visit (HOSPITAL_COMMUNITY)
Admission: EM | Admit: 2020-12-16 | Discharge: 2020-12-16 | Disposition: A | Discharge status: Home / Self Care | Payer: Medicaid Other | Attending: Family Medicine | Admitting: Family Medicine

## 2020-12-16 DIAGNOSIS — N938 Other specified abnormal uterine and vaginal bleeding: Secondary | ICD-10-CM

## 2020-12-16 DIAGNOSIS — Z3202 Encounter for pregnancy test, result negative: Secondary | ICD-10-CM | POA: Diagnosis not present

## 2020-12-16 LAB — POCT URINALYSIS DIPSTICK, ED / UC
Bilirubin Urine: NEGATIVE
Glucose, UA: NEGATIVE mg/dL
Ketones, ur: NEGATIVE mg/dL
Leukocytes,Ua: NEGATIVE
Nitrite: NEGATIVE
Protein, ur: NEGATIVE mg/dL
Specific Gravity, Urine: >=1.03 (ref 1.005–1.030)
Urobilinogen, UA: 0.2 mg/dL (ref 0.0–1.0)
pH: 5.5 (ref 5.0–8.0)

## 2020-12-16 LAB — POC URINE PREG, ED: Preg Test, Ur: NEGATIVE

## 2020-12-16 MED ORDER — MEGESTROL ACETATE 40 MG PO TABS: 40.0000 mg | ORAL_TABLET | Freq: Two times a day (BID) | ORAL | 0 refills | Status: DC

## 2020-12-16 NOTE — ED Provider Notes (Signed)
[facility]    CSN: [id number] Arrival date & time: 12/16/20  1630      History   Chief Complaint Chief Complaint  Patient presents with  . abnormal vaginal bleeding    HPI [name] is a 25 y.o. female.   Patient presenting today with heavy vaginal bleeding with clotting for the past week.  She states the past 2 months she has been having irregular and heavy periods.  She went to the ED about a month ago and was given Megace which stopped the bleeding well for the time being.  She tested negative for STIs and urinary tract infection at this point.  She states she had recently gotten off of the NuvaRing prior to this all starting.  She is followed up with her OB OB/GYN since the ED visit who performed an ultrasound and is recommending a Pap smear and IUD insertion.  She has this appointment scheduled for next week but then started bleeding heavily again this week.  She is not been trying anything over-the-counter for her symptoms at this time.  LMP was 11/21/2020 prior to this bleeding episode     Past Medical History:  Diagnosis Date  . Medical history non-contributory     There are no problems to display for this patient.   Past Surgical History:  Procedure Laterality Date  . DILATION AND CURETTAGE OF UTERUS      OB History    Gravida  2   Para  1   Term  1   Preterm      AB  1   Living  1     SAB  1   IAB      Ectopic      Multiple  0   Live Births  1            Home Medications    Prior to Admission medications   Medication Sig Start Date End Date Taking? Authorizing Provider  etonogestrel-ethinyl estradiol (NUVARING) 0.12-0.015 MG/24HR vaginal ring Insert vaginally and leave in place for 3 consecutive weeks, then remove for 1 week. Patient not taking: No sig reported 11/17/18   Gerrit Heck, CNM  hydrOXYzine (ATARAX/VISTARIL) 25 MG tablet Take 0.5-1 tablets (12.5-25 mg total) by mouth every 8 (eight) hours as needed for  itching. Patient not taking: No sig reported 03/22/20   Wallis Bamberg, PA-C  ibuprofen (ADVIL,MOTRIN) 600 MG tablet Take 1 tablet (600 mg total) by mouth every 6 (six) hours. Patient not taking: No sig reported 07/08/16   Willodean Rosenthal, MD  iron polysaccharides (NIFEREX) 150 MG capsule Take 1 capsule (150 mg total) by mouth 2 (two) times daily. Patient not taking: No sig reported 07/08/16   Willodean Rosenthal, MD  megestrol (MEGACE) 40 MG tablet Take 1 tablet (40 mg total) by mouth 2 (two) times daily. 12/16/20   Particia Nearing, PA-C  Multiple Vitamin (MULTI-VITAMIN) tablet Take 1 tablet by mouth daily. Patient not taking: No sig reported    [provider]  omeprazole (PRILOSEC) 20 MG capsule Take 1 capsule (20 mg total) by mouth daily. Patient not taking: No sig reported 10/13/17   Elpidio Anis, PA-C  Prenat-FeAsp-Meth-FA-DHA w/o A (PRENATE PIXIE) 10-0.6-0.4-200 MG CAPS Take 1 tablet by mouth daily. Patient not taking: No sig reported 04/25/16   Orvilla Cornwall A, CNM  Prenatal-Fe Fum-Methf-FA w/o A (VITAFOL-NANO) 18-0.6-0.4 MG TABS Take 1 tablet by mouth daily. Patient not taking: No sig reported 11/12/17   Roe Coombs,  CNM  terbinafine (LAMISIL) 250 MG tablet Take 1 tablet (250 mg total) by mouth daily. Patient not taking: No sig reported 11/03/20   Vivi Barrack, DPM  terconazole (TERAZOL 3) 0.8 % vaginal cream Place 1 applicator vaginally at bedtime. Patient not taking: No sig reported 11/18/17   Roe Coombs, CNM    Family History Family History  Problem Relation Age of Onset  . Diabetes Paternal Grandmother   . Hypertension Paternal Grandmother   . Hypertension Mother   . Diabetes Mother   . Sleep apnea Mother   . Hypertension Father   . Diabetes Father   . Sleep apnea Father     Social History Social History   Tobacco Use  . Smoking status: Never Smoker  . Smokeless tobacco: Never Used  Vaping Use  . Vaping Use: Never used   Substance Use Topics  . Alcohol use: No    Alcohol/week: 0.0 standard drinks  . Drug use: No     Allergies   Patient has no known allergies.   Review of Systems Review of Systems Per HPI  Physical Exam Triage Vital Signs ED Triage Vitals  Enc Vitals Group     BP 12/16/20 1652 129/89     Pulse Rate 12/16/20 1652 79     Resp 12/16/20 1652 18     Temp 12/16/20 1652 98.7 F (37.1 C)     Temp Source 12/16/20 1652 Oral     SpO2 12/16/20 1652 98 %     Weight --      Height --      Head Circumference --      Peak Flow --      Pain Score 12/16/20 1649 3     Pain Loc --      Pain Edu? --      Excl. in GC? --    No data found.  Updated Vital Signs BP 129/89 (BP Location: Left Arm)   Pulse 79   Temp 98.7 F (37.1 C) (Oral)   Resp 18   LMP 11/21/2020   SpO2 98%   Visual Acuity Right Eye Distance:   Left Eye Distance:   Bilateral Distance:    Right Eye Near:   Left Eye Near:    Bilateral Near:     Physical Exam Vitals and nursing note reviewed.  Constitutional:      Appearance: Normal appearance. She is not ill-appearing.  HENT:     Head: Atraumatic.  Eyes:     Extraocular Movements: Extraocular movements intact.     Conjunctiva/sclera: Conjunctivae normal.  Cardiovascular:     Rate and Rhythm: Normal rate and regular rhythm.     Heart sounds: Normal heart sounds.  Pulmonary:     Effort: Pulmonary effort is normal.     Breath sounds: Normal breath sounds.  Abdominal:     General: Bowel sounds are normal. There is no distension.     Palpations: Abdomen is soft.     Tenderness: There is no abdominal tenderness. There is no right CVA tenderness, left CVA tenderness or guarding.  Genitourinary:    Comments: GU exam declined, deferred to GYN Musculoskeletal:        General: Normal range of motion.     Cervical back: Normal range of motion and neck supple.  Skin:    General: Skin is warm and dry.  Neurological:     Mental Status: She is alert and  oriented to person, place, and time.  Psychiatric:  Mood and Affect: Mood normal.        Thought Content: Thought content normal.        Judgment: Judgment normal.      UC Treatments / Results  Labs (all labs ordered are listed, but only abnormal results are displayed) Labs Reviewed  POCT URINALYSIS DIPSTICK, ED / UC - Abnormal; Notable for the following components:      Result Value   Hgb urine dipstick LARGE (*)    All other components within normal limits  POC URINE PREG, ED    EKG   Radiology No results found.  Procedures Procedures (including critical care time)  Medications Ordered in UC Medications - No data to display  Initial Impression / Assessment and Plan / UC Course  I have reviewed the triage vital signs and the nursing notes.  Pertinent labs & imaging results that were available during my care of the patient were reviewed by me and considered in my medical decision making (see chart for details).     Vitals, exam, UA very reassuring today.  Urine pregnancy negative.  Will refill Megace to stop bleeding until she can see OB/GYN for follow-up next week as scheduled.  Final Clinical Impressions(s) / UC Diagnoses   Final diagnoses:  Dysfunctional uterine bleeding     Discharge Instructions     Follow-up with gynecology as scheduled    ED Prescriptions    Medication Sig Dispense Auth. Provider   megestrol (MEGACE) 40 MG tablet Take 1 tablet (40 mg total) by mouth 2 (two) times daily. 14 tablet Particia Nearing, [place]     PDMP not reviewed this encounter.   Particia Nearing, [place] 12/16/20 1735

## 2020-12-16 NOTE — Discharge Instructions (Signed)
Follow up with gynecology as scheduled.

## 2020-12-16 NOTE — ED Triage Notes (Signed)
PT reports abnormal vaginal bleeding for 2 months

## 2020-12-20 NOTE — Progress Notes (Signed)
   Subjective: 25 y.o. female presenting today for follow-up evaluation of onychomycosis of toenails specifically to the left great toenail.  She is concerned because she states that the left great toenail appears to be growing inwards.  She would like to have it evaluated.  She is currently taking oral Lamisil as per Dr. Ardelle Anton for fungal nails.  She also complains that her nails are very brittle and cracked very easily.  She presents for further treatment evaluation  Past Medical History:  Diagnosis Date  . Medical history non-contributory     Objective: Physical Exam General: The patient is alert and oriented x3 in no acute distress.  Dermatology: Hyperkeratotic, discolored, thickened, onychodystrophy noted to the left hallux nail plate and remaining nails as well. Skin is warm, dry and supple bilateral lower extremities. Negative for open lesions or macerations.  Vascular: Palpable pedal pulses bilaterally. No edema or erythema noted. Capillary refill within normal limits.  Neurological: Epicritic and protective threshold grossly intact bilaterally.   Musculoskeletal Exam: Range of motion within normal limits to all pedal and ankle joints bilateral. Muscle strength 5/5 in all groups bilateral.   Assessment: #1 Onychomycosis of toenail left hallux #2 Hyperkeratotic brittle nails bilateral  Plan of Care:  #1 Patient was evaluated. #2  Continue oral Lamisil 250 mg daily as per Dr. Ardelle Anton #3  Recommend OTC urea 50% nail gel available on [employer] #4 mechanical debridement of the toenail to the left hallux was performed using a nail nipper without incident or bleeding.  This gave the patient some relief immediately after debridement #5 return to clinic on next scheduled appointment   Felecia Shelling, DPM [facility]  Dr. Felecia Shelling, DPM    [address] [place], [place] [address]                Office [phone]  Fax [phone]

## 2020-12-25 ENCOUNTER — Other Ambulatory Visit: Payer: Self-pay

## 2020-12-25 ENCOUNTER — Other Ambulatory Visit (HOSPITAL_COMMUNITY)
Admission: RE | Admit: 2020-12-25 | Discharge: 2020-12-25 | Disposition: A | Discharge status: Home / Self Care | Payer: Medicaid Other | Source: Ambulatory Visit | Attending: Nurse Practitioner | Admitting: Nurse Practitioner

## 2020-12-25 ENCOUNTER — Ambulatory Visit (INDEPENDENT_AMBULATORY_CARE_PROVIDER_SITE_OTHER): Payer: Medicaid Other | Admitting: Nurse Practitioner

## 2020-12-25 ENCOUNTER — Encounter: Payer: Self-pay | Admitting: Nurse Practitioner

## 2020-12-25 VITALS — BP 137/100 | HR 94 | Ht 67.0 in | Wt 302.2 lb

## 2020-12-25 DIAGNOSIS — Z30430 Encounter for insertion of intrauterine contraceptive device: Secondary | ICD-10-CM

## 2020-12-25 DIAGNOSIS — Z113 Encounter for screening for infections with a predominantly sexual mode of transmission: Secondary | ICD-10-CM | POA: Diagnosis not present

## 2020-12-25 DIAGNOSIS — Z01812 Encounter for preprocedural laboratory examination: Secondary | ICD-10-CM | POA: Diagnosis not present

## 2020-12-25 DIAGNOSIS — Z124 Encounter for screening for malignant neoplasm of cervix: Secondary | ICD-10-CM | POA: Diagnosis not present

## 2020-12-25 LAB — POCT URINE PREGNANCY: Preg Test, Ur: NEGATIVE

## 2020-12-25 MED ORDER — LEVONORGESTREL 19.5 MCG/DAY IU IUD: INTRAUTERINE_SYSTEM | Freq: Once | INTRAUTERINE | Status: AC

## 2020-12-25 NOTE — Addendum Note (Signed)
Addended by: Leola Brazil on: 12/25/2020 04:46 PM   Modules accepted: Orders

## 2020-12-25 NOTE — Patient Instructions (Addendum)

## 2020-12-25 NOTE — Progress Notes (Signed)
GYNECOLOGY OFFICE VISIT NOTE   History:  25 y.o. G2P1011 here today for IUD insertion to treat her history of abnormal vaginal bleeding and pap smear. Previous notes reviewed.  Dr. Earlene Plater recommended this plan of care.  Reports intercourse 2 days ago.  Reviewed options of waiting 2 weeks and doing another pregnancy test to rule out pregnancy completely before insertion.  Client wants to proceed with IUD insertion today and then take out the IUD if she does become pregnant.  Wants STD testing done today.  She denies any abnormal vaginal discharge, bleeding, pelvic pain or other concerns.   Past Medical History:  Diagnosis Date  . Medical history non-contributory     Past Surgical History:  Procedure Laterality Date  . DILATION AND CURETTAGE OF UTERUS      The following portions of the patient's history were reviewed and updated as appropriate: allergies, current medications, past family history, past medical history, past social history, past surgical history and problem list.   Health Maintenance:  Normal pap on 11-11-17.    Review of Systems:  Pertinent items noted in HPI and remainder of comprehensive ROS otherwise negative.  Objective:  Physical Exam BP (!) 137/100   Pulse 94   Ht 5\' 7"  (1.702 m)   Wt (!) 302 lb 3.2 oz (137.1 kg)   BMI 47.33 kg/m  CONSTITUTIONAL: Well-developed, well-nourished female in no acute distress.  HENT:  Normocephalic, atraumatic. External right and left ear normal.  EYES: Conjunctivae and EOM are normal. Pupils are equal, round.  No scleral icterus.  NECK: Normal range of motion, supple, no masses SKIN: Skin is warm and dry. No rash noted. Not diaphoretic. No erythema. No pallor. NEUROLOGIC: Alert and oriented to person, place, and time. Normal muscle tone coordination. No cranial nerve deficit noted. PSYCHIATRIC: Normal mood and affect. Normal behavior. Normal judgment and thought content. CARDIOVASCULAR: Normal heart rate noted RESPIRATORY:  Effort and breath sounds normal, no problems with respiration noted ABDOMEN: Soft, no distention noted.   PELVIC: normal genetalia, small amount of bleeding from cervix, uterus retroverted MUSCULOSKELETAL: Normal range of motion. No edema noted.  Labs and Imaging No results found.  Assessment & Plan:  1. Encounter for IUD insertion  IUD Insertion Procedure Note Patient identified, informed consent performed, consent signed.   Discussed risks of irregular bleeding, cramping, infection, malpositioning or misplacement of the IUD outside the uterus which may require further procedure such as laparoscopy. Time out was performed.  Urine pregnancy test negative.  Speculum placed in the vagina.  Cervix visualized.  Cleaned with Betadine x 3.  Hurricane spray used.  Grasped anteriorly with a single tooth tenaculum.  Uterus sounded to 8 cm.  Liletta IUD placed per manufacturer's recommendations.  Strings trimmed to 3-4 cm. Tenaculum was removed, good hemostasis achieved with application of silver nitrate and monsel's solution to site of tenaculum insertion.  Patient tolerated procedure well.   Patient was given post-procedure instructions.  She was advised to have backup contraception for one week.  Patient was also asked to check IUD strings periodically and follow up in 4 weeks for IUD check.    2. Encounter for Papanicolaou smear of cervix Done prior top IUD insertion   Routine preventative health maintenance measures emphasized. Please refer to After Visit Summary for other counseling recommendations.   Return in about 4 weeks (around 01/22/2021) for IUD string check.   Total face-to-face time with patient: 25 minutes.  Over 50% of encounter was spent on counseling and  coordination of care.  Nolene Bernheim, RN, MSN, NP-BC Nurse Practitioner, [facility] for [employer], [facility] 12/25/2020 3:48 PM

## 2020-12-26 LAB — HIV ANTIBODY (ROUTINE TESTING W REFLEX): HIV Screen 4th Generation wRfx: NONREACTIVE

## 2020-12-26 LAB — HEPATITIS C ANTIBODY: Hep C Virus Ab: <0.1 s/co ratio (ref 0.0–0.9)

## 2020-12-26 LAB — RPR: RPR Ser Ql: NONREACTIVE

## 2020-12-26 LAB — HEPATITIS B SURFACE ANTIGEN: Hepatitis B Surface Ag: NEGATIVE

## 2020-12-27 LAB — CYTOLOGY - PAP
Chlamydia: NEGATIVE
Comment: NEGATIVE
Comment: NEGATIVE
Comment: NORMAL
Diagnosis: NEGATIVE
Neisseria Gonorrhea: NEGATIVE
Trichomonas: NEGATIVE

## 2021-01-22 ENCOUNTER — Ambulatory Visit (INDEPENDENT_AMBULATORY_CARE_PROVIDER_SITE_OTHER): Payer: Medicaid Other | Admitting: Advanced Practice Midwife

## 2021-01-22 ENCOUNTER — Other Ambulatory Visit: Payer: Self-pay

## 2021-01-22 VITALS — BP 140/93 | HR 103 | Wt 306.0 lb

## 2021-01-22 DIAGNOSIS — N939 Abnormal uterine and vaginal bleeding, unspecified: Secondary | ICD-10-CM | POA: Diagnosis not present

## 2021-01-22 DIAGNOSIS — Z975 Presence of (intrauterine) contraceptive device: Secondary | ICD-10-CM | POA: Diagnosis not present

## 2021-01-22 DIAGNOSIS — N921 Excessive and frequent menstruation with irregular cycle: Secondary | ICD-10-CM | POA: Diagnosis not present

## 2021-01-22 MED ORDER — BALCOLTRA 0.1-20 MG-MCG(21) PO TABS: 1.0000 | ORAL_TABLET | Freq: Every day | ORAL | 3 refills | Status: AC

## 2021-01-22 NOTE — Progress Notes (Signed)
   GYNECOLOGY CLINIC PROGRESS NOTE  History:  25 y.o. G2P1011 here at [facility] today for today for IUD string check; Liletta IUD was placed  12/25/20. Pt had AUB before IUD placed, and bleeding has continued, like a period every day since IUD placed. She took Megace she had from prior to IUD insertion and bleeding slowed x 3 days but resumed.  She has mild HTN recently without a formal diagnosis, and reports she is losing weight which will improve her BP. She denies any personal or family hx of DVT/PE and no migraines with aura. She is a nonsmoker.   There are no other concerns/problems with the IUD.  The following portions of the patient's history were reviewed and updated as appropriate: allergies, current medications, past family history, past medical history, past social history, past surgical history and problem list. Last pap smear on *12/25/20 was normal.  Review of Systems:  Pertinent items are noted in HPI.   Objective:  Physical Exam Blood pressure (!) 140/93, pulse (!) 103, weight (!) 306 lb (138.8 kg). Gen: NAD Abd: Soft, nontender and nondistended Pelvic: Normal appearing external genitalia; normal appearing vaginal mucosa and cervix.  IUD strings visualized, about 4 cm in length outside cervix.   Assessment & Plan:  1. Breakthrough bleeding associated with intrauterine device (IUD) --Discussed options with pt, and likely OCPs will best treat bleeding.  OCPs can raise BP so pt is to watch BP weekly on home cuff. Low dose OCP x 1 month for short course. Pt to stop after 1 month and see if bleeding persists. Refills prescribed for repeat tx if needed.  Reviewed s/sx of DVT/PE/reasons to seek care.    - Levonorgest-Eth Estrad-Fe Bisg (BALCOLTRA) 0.1-20 MG-MCG(21) TABS; Take 1 tablet by mouth daily for 28 days.  Dispense: 28 tablet; Refill: 3  --F/U in 2 months for AUB/breakthrough bleeding   Patient to keep IUD in place for 6-7 years; can come in for removal if she desires  pregnancy within the next 7 years. Routine preventative health maintenance measures emphasized.  Sharen Counter, CNM 3:44 PM

## 2021-01-22 NOTE — Patient Instructions (Signed)
Abnormal Uterine Bleeding  Abnormal uterine bleeding is unusual bleeding from the uterus. It includes bleeding after sex, or bleeding or spotting between menstrual periods. It may also include bleeding that is heavier than normal, menstrual periods that last longer than usual, or bleeding that occurs after menopause. Abnormal uterine bleeding can affect teenagers, women in their reproductive years, pregnant women, and women who have reached menopause. Common causes of abnormal uterine bleeding include:  Pregnancy.  Growths of tissue (polyps).  Benign tumors or growths in the uterus (fibroids). These are not cancer.  Infection.  Cancer.  Too much or too little of some hormones in the body (hormonal imbalances). Any type of abnormal bleeding should be checked by a health care provider. Many cases are minor and simple to treat, but others may be more serious. Treatment will depend on the cause and severity of the bleeding. Follow these instructions at home: Medicines  Take over-the-counter and prescription medicines only as told by your health care provider.  Tell your health care provider about other medicines that you take. You may be asked to stop taking aspirin or medicines that contain aspirin. These medicines can make bleeding worse.  If you were prescribed iron pills, take them as told by your health care provider. Iron pills help to replace iron that your body loses because of this condition. Managing constipation In cases of severe bleeding, you may be asked to increase your iron intake to treat anemia. This may cause constipation. To prevent or treat constipation, you may need to:  Drink enough fluid to keep your urine pale yellow.  Take over-the-counter or prescription medicines.  Eat foods that are high in fiber, such as beans, whole grains, and fresh fruits and vegetables.  Limit foods that are high in fat and processed sugars, such as fried or sweet foods. General  instructions  Monitor your condition for any changes.  Do not use tampons, douche, or have sex until your health care provider says these things are okay.  Change your pads often.  Get regular exams. This includes pelvic exams and cervical cancer screenings. ? It is up to you to get the results of any tests that are done. Ask your health care provider, or the department that is doing the tests, when your results will be ready.  Keep all follow-up visits as told by your health care provider. This is important. Contact a health care provider if you:  Have bleeding that lasts for more than 1 week.  Feel dizzy at times.  Feel nauseous or you vomit.  Feel light-headed or weak.  Notice any other changes that show that your condition is getting worse. Get help right away if you:  Pass out.  Have bleeding that soaks through a pad every hour.  Have pain in the abdomen.  Have a fever or chills.  Become sweaty or weak.  Pass large blood clots from your vagina. Summary  Abnormal uterine bleeding is unusual bleeding from the uterus.  Any type of abnormal bleeding should be evaluated by a health care provider. Many cases are minor and simple to treat, but others may be more serious.  Treatment will depend on the cause of the bleeding.  Get help right away if you pass out, you have bleeding that soaks through a pad every hour, or you pass large blood clots from your vagina. This information is not intended to replace advice given to you by your health care provider. Make sure you discuss any questions you   have with your health care provider. Document Revised: 05/03/2020 Document Reviewed: 06/29/2019 Elsevier Patient Education  2021 Elsevier Inc.  

## 2021-01-22 NOTE — Progress Notes (Signed)
Reports that she is still bleeding every day. Using 3-4 pads per day. Finished 2 week supply Megace.

## 2021-04-05 ENCOUNTER — Ambulatory Visit (INDEPENDENT_AMBULATORY_CARE_PROVIDER_SITE_OTHER): Payer: Medicaid Other | Admitting: Podiatry

## 2021-04-05 ENCOUNTER — Other Ambulatory Visit: Payer: Self-pay

## 2021-04-05 DIAGNOSIS — L600 Ingrowing nail: Secondary | ICD-10-CM

## 2021-04-05 NOTE — Patient Instructions (Signed)

## 2021-04-09 NOTE — Progress Notes (Signed)
Subjective: 25 year old female presents the office today for concerns of ingrown toenails to the left big toe along both nail borders with the medial side worse.  This has been getting worse over last week.  No redness but she does note some swelling.  No drainage.  Describes throbbing discomfort with pain level 7/10.  She been taking ibuprofen as well as soaking in Epson salts.  She previously had finished a course of Lamisil and has been using over-the-counter urea nail gel.  Denies any systemic complaints such as fevers, chills, nausea, vomiting. No acute changes since last appointment, and no other complaints at this time.   Objective: AAO x3, NAD DP/PT pulses palpable bilaterally, CRT less than 3 seconds Incurvation present to the left hallux nail border along with both the medial lateral nail borders with the medial side the most symptomatic.  There is localized edema there is no erythema or warmth.  No drainage or pus or ascending cellulitis.  There is no fluctuance crepitation there is no open lesions. No open lesions or pre-ulcerative lesions.  No pain with calf compression, swelling, warmth, erythema  Assessment: Left hallux ingrown toenail  Plan: -All treatment options discussed with the patient including all alternatives, risks, complications.  -At this time, the patient is requesting partial nail removal with chemical matricectomy to the symptomatic portion of the nail. Risks and complications were discussed with the patient for which they understand and written consent was obtained. Under sterile conditions a total of 3 mL of a mixture of 2% lidocaine plain and 0.5% Marcaine plain was infiltrated in a hallux block fashion. Once anesthetized, the skin was prepped in sterile fashion. A tourniquet was then applied. Next the medial and lateral aspect of hallux nail border was then sharply excised making sure to remove the entire offending nail border. Once the nails were ensured to be  removed area was debrided and the underlying skin was intact. There is no purulence identified in the procedure. Next phenol was then applied under standard conditions and copiously irrigated. Silvadene was applied. A dry sterile dressing was applied. After application of the dressing the tourniquet was removed and there is found to be an immediate capillary refill time to the digit. The patient tolerated the procedure well any complications. Post procedure instructions were discussed the patient for which he verbally understood. Follow-up in one week for nail check or sooner if any problems are to arise. Discussed signs/symptoms of infection and directed to call the office immediately should any occur or go directly to the emergency room. In the meantime, encouraged to call the office with any questions, concerns, changes symptoms. -Patient encouraged to call the office with any questions, concerns, change in symptoms.   Vivi Barrack DPM

## 2021-04-24 ENCOUNTER — Encounter: Payer: Self-pay | Admitting: Podiatry

## 2021-04-24 ENCOUNTER — Other Ambulatory Visit: Payer: Self-pay

## 2021-04-24 ENCOUNTER — Ambulatory Visit (INDEPENDENT_AMBULATORY_CARE_PROVIDER_SITE_OTHER): Payer: Medicaid Other | Admitting: Podiatry

## 2021-04-24 DIAGNOSIS — L600 Ingrowing nail: Secondary | ICD-10-CM

## 2021-04-24 DIAGNOSIS — B351 Tinea unguium: Secondary | ICD-10-CM

## 2021-05-02 NOTE — Progress Notes (Signed)
Subjective: 25 year old female presents the office today for follow evaluation status post partial nail avulsion left hallux.  She states that on the toenail standpoint she is doing well from the procedure.  Is healed.  Denies any swelling or redness or drainage.  Of the nail still thick and discolored.  She is been using the urea nail gel.  She previously was on Lamisil.  No fevers or chills.  No other concerns.    Objective: AAO x3, NAD DP/PT pulses palpable bilaterally, CRT less than 3 seconds Status post partial nail avulsion.  Scab is formed over the area there is no edema, erythema, drainage or pus.  No open lesions.  No drainage or pus.  The nail itself is still hypertrophic, dystrophic with brown discoloration of the distal one half of the nail at the proximal half does appear to be more clear in color.  There is no edema, erythema or signs of infection. No pain with calf compression, swelling, warmth, erythema  Assessment: Status post partial nail avulsion with onychomycosis  Plan: -All treatment options discussed with the patient including all alternatives, risks, complications.  -For the procedure standpoint she is doing well and this is healed.  From the nail dystrophy, onychomycosis standpoint this does seem to be growing out.  I encouraged her to continue with the topical medication as it does seem to be helping I discussed with the length of time to continue with this. -Patient encouraged to call the office with any questions, concerns, change in symptoms.   Vivi Barrack DPM

## 2021-05-29 ENCOUNTER — Ambulatory Visit: Payer: Medicaid Other | Admitting: Advanced Practice Midwife

## 2021-05-29 NOTE — Progress Notes (Deleted)
   GYNECOLOGY PROGRESS NOTE  History:  25 y.o. G2P1011 presents to [facility] *** office today for problem gyn visit. She reports *****.  She denies h/a, dizziness, shortness of breath, n/v, or fever/chills.    The following portions of the patient's history were reviewed and updated as appropriate: allergies, current medications, past family history, past medical history, past social history, past surgical history and problem list. Last pap smear on *** was normal, *** HRHPV.  Health Maintenance Due  Topic Date Due   COVID-19 Vaccine (1) Never done   INFLUENZA VACCINE  Never done     Review of Systems:  Pertinent items are noted in HPI.   Objective:  Physical Exam There were no vitals taken for this visit. VS reviewed, nursing note reviewed,  Constitutional: well developed, well nourished, no distress HEENT: normocephalic CV: normal rate Pulm/chest wall: normal effort Breast Exam: deferred Abdomen: soft Neuro: alert and oriented x 3 Skin: warm, dry Psych: affect normal Pelvic exam: Cervix pink, visually closed, without lesion, scant white creamy discharge, vaginal walls and external genitalia normal Bimanual exam: Cervix 0/long/high, firm, anterior, neg CMT, uterus nontender, nonenlarged, adnexa without tenderness, enlargement, or mass  Assessment & Plan:  1. IUD check up ***   Sharen Counter, CNM 1:03 PM

## 2021-06-05 ENCOUNTER — Ambulatory Visit: Payer: Medicaid Other | Admitting: Podiatry

## 2021-10-03 ENCOUNTER — Ambulatory Visit (INDEPENDENT_AMBULATORY_CARE_PROVIDER_SITE_OTHER): Payer: Medicaid Other | Admitting: Bariatrics

## 2021-10-03 ENCOUNTER — Encounter (INDEPENDENT_AMBULATORY_CARE_PROVIDER_SITE_OTHER): Payer: Self-pay | Admitting: Bariatrics

## 2021-10-03 ENCOUNTER — Other Ambulatory Visit: Payer: Self-pay

## 2021-10-03 VITALS — BP 135/85 | HR 85 | Temp 98.2°F | Ht 66.0 in | Wt 312.0 lb

## 2021-10-03 DIAGNOSIS — R030 Elevated blood-pressure reading, without diagnosis of hypertension: Secondary | ICD-10-CM | POA: Diagnosis not present

## 2021-10-03 DIAGNOSIS — R0602 Shortness of breath: Secondary | ICD-10-CM

## 2021-10-03 DIAGNOSIS — E559 Vitamin D deficiency, unspecified: Secondary | ICD-10-CM

## 2021-10-03 DIAGNOSIS — R7309 Other abnormal glucose: Secondary | ICD-10-CM

## 2021-10-03 DIAGNOSIS — D508 Other iron deficiency anemias: Secondary | ICD-10-CM

## 2021-10-03 DIAGNOSIS — Z6841 Body Mass Index (BMI) 40.0 and over, adult: Secondary | ICD-10-CM

## 2021-10-03 DIAGNOSIS — Z1331 Encounter for screening for depression: Secondary | ICD-10-CM | POA: Diagnosis not present

## 2021-10-03 DIAGNOSIS — R5383 Other fatigue: Secondary | ICD-10-CM | POA: Diagnosis not present

## 2021-10-03 NOTE — Progress Notes (Signed)
Chief Complaint:   OBESITY [name] (MR# [id number]) is a 26 y.o. female who presents for evaluation and treatment of obesity and related comorbidities. Current BMI is Body mass index is 50.36 kg/m. [name] has been struggling with her weight for many years and has been unsuccessful in either losing weight, maintaining weight loss, or reaching her healthy weight goal.  [name] states that she loves to cook. She craves pasta, bread and sometimes sweets.  [name] is currently in the action stage of change and ready to dedicate time achieving and maintaining a healthier weight. [name] is interested in becoming our patient and working on intensive lifestyle modifications including (but not limited to) diet and exercise for weight loss.  [name]'s habits were reviewed today and are as follows: Her family eats meals together, she thinks her family will eat healthier with her, her desired weight loss is 60 pounds, she has been heavy most of her life, she started gaining weight after she had a child, she has significant food cravings issues, she snacks frequently in the evenings, she skips meals frequently, she is frequently drinking liquids with calories, she frequently makes poor food choices, she frequently eats larger portions than normal, she has binge eating behaviors, and she struggles with emotional eating.  Depression Screen [name]'s Food and Mood (modified PHQ-9) score was 16.  Depression screen [name] 2/9 10/03/2021  Decreased Interest 3  Down, Depressed, Hopeless 3  PHQ - 2 Score 6  Altered sleeping 2  Tired, decreased energy 3  Change in appetite 2  Feeling bad or failure about yourself  2  Trouble concentrating 1  Moving slowly or fidgety/restless 0  Suicidal thoughts 0  PHQ-9 Score 16  Difficult doing work/chores Very difficult   Subjective:   1. Other fatigue [name] admits to daytime somnolence and admits to waking up still tired and refreshed. Patent has a history  of symptoms of daytime fatigue and morning fatigue. [name] generally gets 5 hours of sleep per night, and states that she has generally restful sleep. Snoring is not present. Apneic episodes are not present. Epworth Sleepiness Score is 5.   2. SOB (shortness of breath) on exertion [name] notes increasing shortness of breath with exercising and seems to be worsening over time with weight gain. She notes getting out of breath sooner with activity than she used to. This has not gotten worse recently. [name] denies shortness of breath at rest or orthopnea.  3. Elevated blood pressure reading [name] is not on medications currently.  4. Elevated glucose [name] notes a family history of diabetes.   5. Vitamin D deficiency [name] is currently not on Vitamin D.  6. Other iron deficiency anemia [name] has had heavy menstrual bleeding at one time.   Assessment/Plan:   1. Other fatigue [name] does feel that her weight is causing her energy to be lower than it should be. Fatigue may be related to obesity, depression or many other causes. Labs will be ordered, and in the meanwhile, [name] will focus on self care including making healthy food choices, increasing physical activity and focusing on stress reduction.   - EKG 12-Lead - TSH+T4F+T3Free  2. SOB (shortness of breath) on exertion [name] does feel that she gets out of breath more easily that she used to when she exercises. [name]'s shortness of breath appears to be obesity related and exercise induced. She has agreed to work on weight loss and gradually increase exercise to treat her exercise induced shortness of breath. Will  continue to monitor closely.   3. Elevated blood pressure reading We will follow over time. We will check labs today. [name] is working on healthy weight loss and exercise to improve blood pressure control. We will watch for signs of hypotension as she continues her lifestyle modifications.  - Lipid Panel With  LDL/HDL Ratio - Comprehensive metabolic panel  4. Elevated glucose We will check A1C, insulin, CMP, and Lipids today.   - Hemoglobin A1c - Insulin, random - Anemia panel - Comprehensive metabolic panel  5. Vitamin D deficiency Low Vitamin D level contributes to fatigue and are associated with obesity, breast, and colon cancer. We will check Vitamin D today and [name] will follow-up for routine testing of Vitamin D, at least 2-3 times per year to avoid over-replacement.  - TSH+T4F+T3Free - VITAMIN D 25 Hydroxy (Vit-D Deficiency, Fractures)  6. Other iron deficiency anemia Orders and follow up as documented in patient record. We will check anemia panel today.  Counseling Iron is essential for our bodies to make red blood cells.  Reasons that someone may be deficient include: an iron-deficient diet (more likely in those following vegan or vegetarian diets), women with heavy menses, patients with GI disorders or poor absorption, patients that have had bariatric surgery, frequent blood donors, patients with cancer, and patients with heart disease.   An iron supplement has been recommended. This is found over-the-counter.  Iron-rich foods include dark leafy greens, red and white meats, eggs, seafood, and beans.   Certain foods and drinks prevent your body from absorbing iron properly. Avoid eating these foods in the same meal as iron-rich foods or with iron supplements. These foods include: coffee, black tea, and red wine; milk, dairy products, and foods that are high in calcium; beans and soybeans; whole grains.  Constipation can be a side effect of iron supplementation. Increased water and fiber intake are helpful. Water goal: > 2 liters/day. Fiber goal: > 25 grams/day.   7. Depression screen [name] had a positive depression screening. Depression is commonly associated with obesity and often results in emotional eating behaviors. We will monitor this closely and work on CBT to help improve  the non-hunger eating patterns. Referral to Psychology may be required if no improvement is seen as she continues in our clinic.   8. Class 3 severe obesity with serious comorbidity and body mass index (BMI) of 50.0 to 59.9 in adult, unspecified obesity type (HCC) [name] is currently in the action stage of change and her goal is to continue with weight loss efforts. I recommend [name] begin the structured treatment plan as follows:  She has agreed to the Category 3 Plan.  [name] will have no excessive snacks at night. She will not skip meals and she will have no sugary drinks. She will have a shake in the morning.   Exercise goals: No exercise has been prescribed at this time.   Behavioral modification strategies: increasing lean protein intake, decreasing simple carbohydrates, increasing vegetables, increasing water intake, decreasing eating out, no skipping meals, meal planning and cooking strategies, keeping healthy foods in the home, and planning for success.  She was informed of the importance of frequent follow-up visits to maximize her success with intensive lifestyle modifications for her multiple health conditions. She was informed we would discuss her lab results at her next visit unless there is a critical issue that needs to be addressed sooner. [name] agreed to keep her next visit at the agreed upon time to discuss these results.  Objective:  Pulse 85, temperature 98.2 F (36.8 C), height 5\' 6"  (1.676 m), weight (!) 312 lb (141.5 kg), SpO2 99 %. Body mass index is 50.36 kg/m.  EKG: Normal sinus rhythm, rate 82 bpm.  Indirect Calorimeter completed today shows a VO2 of 340 and a REE of 2347.  Her calculated basal metabolic rate is [id number] thus her basal metabolic rate is better than expected.  General: Cooperative, alert, well developed, in no acute distress. HEENT: Conjunctivae and lids unremarkable. Cardiovascular: Regular rhythm.  Lungs: Normal work of  breathing. Neurologic: No focal deficits.   Lab Results  Component Value Date   CREATININE 0.93 11/21/2020   BUN 13 11/21/2020   NA 137 11/21/2020   K 3.9 11/21/2020   CL 103 11/21/2020   CO2 25 11/21/2020   Lab Results  Component Value Date   ALT 29 11/21/2020   AST 19 11/21/2020   ALKPHOS 69 11/21/2020   BILITOT 0.6 11/21/2020   Lab Results  Component Value Date   HGBA1C 5.6 11/12/2017   No results found for: INSULIN Lab Results  Component Value Date   TSH 1.220 11/12/2017   No results found for: CHOL, HDL, LDLCALC, LDLDIRECT, TRIG, CHOLHDL Lab Results  Component Value Date   WBC 6.5 11/21/2020   HGB 13.7 11/21/2020   HCT 43.0 11/21/2020   MCV 86.7 11/21/2020   PLT 366 11/21/2020   No results found for: IRON, TIBC, FERRITIN  Attestation Statements:   Reviewed by clinician on day of visit: allergies, medications, problem list, medical history, surgical history, family history, social history, and previous encounter notes.  I, Lizbeth Bark, RMA, am acting as Location manager for [employer], DO.  I have reviewed the above documentation for accuracy and completeness, and I agree with the above. Jearld Lesch, DO

## 2021-10-04 ENCOUNTER — Encounter (INDEPENDENT_AMBULATORY_CARE_PROVIDER_SITE_OTHER): Payer: Self-pay | Admitting: Bariatrics

## 2021-10-04 LAB — LIPID PANEL WITH LDL/HDL RATIO
Cholesterol, Total: 182 mg/dL (ref 100–199)
HDL: 49 mg/dL (ref >39)
LDL Chol Calc (NIH): 121 mg/dL — ABNORMAL HIGH (ref 0–99)
LDL/HDL Ratio: 2.5 ratio (ref 0.0–3.2)
Triglycerides: 66 mg/dL (ref 0–149)
VLDL Cholesterol Cal: 12 mg/dL (ref 5–40)

## 2021-10-04 LAB — ANEMIA PANEL
Ferritin: 42 ng/mL (ref 15–150)
Folate, Hemolysate: 295 ng/mL
Folate, RBC: 721 ng/mL (ref >498)
Hematocrit: 40.9 % (ref 34.0–46.6)
Iron Saturation: 12 % — ABNORMAL LOW (ref 15–55)
Iron: 39 ug/dL (ref 27–159)
Retic Ct Pct: 1.1 % (ref 0.6–2.6)
Total Iron Binding Capacity: 325 ug/dL (ref 250–450)
UIBC: 286 ug/dL (ref 131–425)
Vitamin B-12: 464 pg/mL (ref 232–1245)

## 2021-10-04 LAB — COMPREHENSIVE METABOLIC PANEL
ALT: 19 IU/L (ref 0–32)
AST: 14 IU/L (ref 0–40)
Albumin/Globulin Ratio: 1.3 (ref 1.2–2.2)
Albumin: 4.2 g/dL (ref 3.9–5.0)
Alkaline Phosphatase: 84 IU/L (ref 44–121)
BUN/Creatinine Ratio: 13 (ref 9–23)
BUN: 12 mg/dL (ref 6–20)
Bilirubin Total: <0.2 mg/dL (ref 0.0–1.2)
CO2: 22 mmol/L (ref 20–29)
Calcium: 9.2 mg/dL (ref 8.7–10.2)
Chloride: 103 mmol/L (ref 96–106)
Creatinine, Ser: 0.89 mg/dL (ref 0.57–1.00)
Globulin, Total: 3.3 g/dL (ref 1.5–4.5)
Glucose: 119 mg/dL — ABNORMAL HIGH (ref 70–99)
Potassium: 4.6 mmol/L (ref 3.5–5.2)
Sodium: 140 mmol/L (ref 134–144)
Total Protein: 7.5 g/dL (ref 6.0–8.5)
eGFR: 91 mL/min/{1.73_m2} (ref >59)

## 2021-10-04 LAB — TSH+T4F+T3FREE
Free T4: 1.43 ng/dL (ref 0.82–1.77)
T3, Free: 3.4 pg/mL (ref 2.0–4.4)
TSH: 1.56 u[IU]/mL (ref 0.450–4.500)

## 2021-10-04 LAB — VITAMIN D 25 HYDROXY (VIT D DEFICIENCY, FRACTURES): Vit D, 25-Hydroxy: 10.2 ng/mL — ABNORMAL LOW (ref 30.0–100.0)

## 2021-10-04 LAB — HEMOGLOBIN A1C
Est. average glucose Bld gHb Est-mCnc: 140 mg/dL
Hgb A1c MFr Bld: 6.5 % — ABNORMAL HIGH (ref 4.8–5.6)

## 2021-10-04 LAB — INSULIN, RANDOM: INSULIN: 53.4 u[IU]/mL — ABNORMAL HIGH (ref 2.6–24.9)

## 2021-10-09 ENCOUNTER — Encounter (INDEPENDENT_AMBULATORY_CARE_PROVIDER_SITE_OTHER): Payer: Self-pay | Admitting: Bariatrics

## 2021-10-09 DIAGNOSIS — E669 Obesity, unspecified: Secondary | ICD-10-CM | POA: Insufficient documentation

## 2021-10-09 DIAGNOSIS — E1169 Type 2 diabetes mellitus with other specified complication: Secondary | ICD-10-CM | POA: Insufficient documentation

## 2021-10-09 DIAGNOSIS — E559 Vitamin D deficiency, unspecified: Secondary | ICD-10-CM | POA: Insufficient documentation

## 2021-10-17 ENCOUNTER — Other Ambulatory Visit (INDEPENDENT_AMBULATORY_CARE_PROVIDER_SITE_OTHER): Payer: Self-pay | Admitting: Bariatrics

## 2021-10-17 ENCOUNTER — Encounter (INDEPENDENT_AMBULATORY_CARE_PROVIDER_SITE_OTHER): Payer: Self-pay | Admitting: Bariatrics

## 2021-10-17 ENCOUNTER — Other Ambulatory Visit: Payer: Self-pay

## 2021-10-17 ENCOUNTER — Ambulatory Visit (INDEPENDENT_AMBULATORY_CARE_PROVIDER_SITE_OTHER): Payer: Medicaid Other | Admitting: Bariatrics

## 2021-10-17 VITALS — BP 126/82 | HR 92 | Temp 98.2°F | Ht 66.0 in | Wt 312.0 lb

## 2021-10-17 DIAGNOSIS — Z7984 Long term (current) use of oral hypoglycemic drugs: Secondary | ICD-10-CM

## 2021-10-17 DIAGNOSIS — E669 Obesity, unspecified: Secondary | ICD-10-CM | POA: Diagnosis not present

## 2021-10-17 DIAGNOSIS — E559 Vitamin D deficiency, unspecified: Secondary | ICD-10-CM

## 2021-10-17 DIAGNOSIS — Z6841 Body Mass Index (BMI) 40.0 and over, adult: Secondary | ICD-10-CM

## 2021-10-17 DIAGNOSIS — E1169 Type 2 diabetes mellitus with other specified complication: Secondary | ICD-10-CM | POA: Diagnosis not present

## 2021-10-17 MED ORDER — VITAMIN D (ERGOCALCIFEROL) 1.25 MG (50000 UNIT) PO CAPS: 50000.0000 [IU] | ORAL_CAPSULE | ORAL | 0 refills | Status: DC

## 2021-10-17 MED ORDER — METFORMIN HCL 500 MG PO TABS: 500.0000 mg | ORAL_TABLET | Freq: Every day | ORAL | 0 refills | Status: DC

## 2021-10-18 NOTE — Progress Notes (Signed)
Chief Complaint:   OBESITY [name] is here to discuss her progress with her obesity treatment plan along with follow-up of her obesity related diagnoses. [name] is on the Category 3 Plan and states she is following her eating plan approximately 90% of the time. Shiann states she is dancing and using the Wii for 90 minutes 3 times per week.  Today's visit was #: 2 Starting weight: 312 lbs Starting date: 10/03/2021 Today's weight: 312 lbs Today's date: 10/17/2021 Total lbs lost to date: 0 Total lbs lost since last in-office visit: 0  Interim History: [name]'s weight remains the same. She did not struggle any. She is getting in her protein.   Subjective:   1. Vitamin D deficiency [name]'s last Vitamin D was 10.2. We discussed labs today.   2. Diabetes mellitus type 2 in obese [facility]) Tenaj is not on medication currently. Her last A1C was 6.5. Her insulin was 53.4. We discussed new labs today.  Assessment/Plan:   1. Vitamin D deficiency Low Vitamin D level contributes to fatigue and are associated with obesity, breast, and colon cancer. Cloretta agrees to start prescription Vitamin D 50,000 IU every week for 1 month with no refills and she will follow-up for routine testing of Vitamin D, at least 2-3 times per year to avoid over-replacement.  - Vitamin D, Ergocalciferol, (DRISDOL) 1.25 MG (50000 UNIT) CAPS capsule; Take 1 capsule (50,000 Units total) by mouth every 7 (seven) days.  Dispense: 4 capsule; Refill: 0  2. Diabetes mellitus type 2 in obese [facility]) [name] agrees to start Metformin 500 mg for 1 month with no refills. Good blood sugar control is important to decrease the likelihood of diabetic complications such as nephropathy, neuropathy, limb loss, blindness, coronary artery disease, and death. Intensive lifestyle modification including diet, exercise and weight loss are the first line of treatment for diabetes.   - metFORMIN (GLUCOPHAGE) 500 MG tablet; Take 1 tablet (500  mg total) by mouth daily with supper.  Dispense: 30 tablet; Refill: 0  3. Obesity, current BMI 50.4 Redonda is currently in the action stage of change. As such, her goal is to continue with weight loss efforts. She has agreed to the Category 3 Plan.   [name] will continue meal planning and she will continue intentional eating. We reviewed labs from 10/03/2021 CMP, Lipids, anemia panel, HCT and insulin. She will increase her water intake.  Exercise goals:  As is.  Behavioral modification strategies: increasing lean protein intake, decreasing simple carbohydrates, increasing vegetables, increasing water intake, decreasing eating out, no skipping meals, meal planning and cooking strategies, keeping healthy foods in the home, and planning for success.  [name] has agreed to follow-up with our clinic in 2 weeks. She was informed of the importance of frequent follow-up visits to maximize her success with intensive lifestyle modifications for her multiple health conditions.   Objective:   Blood pressure 126/82, pulse 92, temperature 98.2 F (36.8 C), height 5\' 6"  (1.676 m), weight (!) 312 lb (141.5 kg), SpO2 97 %. Body mass index is 50.36 kg/m.  General: Cooperative, alert, well developed, in no acute distress. HEENT: Conjunctivae and lids unremarkable. Cardiovascular: Regular rhythm.  Lungs: Normal work of breathing. Neurologic: No focal deficits.   Lab Results  Component Value Date   CREATININE 0.89 10/03/2021   BUN 12 10/03/2021   NA 140 10/03/2021   K 4.6 10/03/2021   CL 103 10/03/2021   CO2 22 10/03/2021   Lab Results  Component Value Date  ALT 19 10/03/2021   AST 14 10/03/2021   ALKPHOS 84 10/03/2021   BILITOT <0.2 10/03/2021   Lab Results  Component Value Date   HGBA1C 6.5 (H) 10/03/2021   HGBA1C 5.6 11/12/2017   Lab Results  Component Value Date   INSULIN 53.4 (H) 10/03/2021   Lab Results  Component Value Date   TSH 1.560 10/03/2021   Lab Results  Component  Value Date   CHOL 182 10/03/2021   HDL 49 10/03/2021   LDLCALC 121 (H) 10/03/2021   TRIG 66 10/03/2021   Lab Results  Component Value Date   VD25OH 10.2 (L) 10/03/2021   Lab Results  Component Value Date   WBC 6.5 11/21/2020   HGB 13.7 11/21/2020   HCT 40.9 10/03/2021   MCV 86.7 11/21/2020   PLT 366 11/21/2020   Lab Results  Component Value Date   IRON 39 10/03/2021   TIBC 325 10/03/2021   FERRITIN 42 10/03/2021   Attestation Statements:   Reviewed by clinician on day of visit: allergies, medications, problem list, medical history, surgical history, family history, social history, and previous encounter notes.  I, Lizbeth Bark, RMA, am acting as Location manager for [employer], DO.  I have reviewed the above documentation for accuracy and completeness, and I agree with the above. Jearld Lesch, DO

## 2021-11-07 ENCOUNTER — Encounter (INDEPENDENT_AMBULATORY_CARE_PROVIDER_SITE_OTHER): Payer: Self-pay | Admitting: Bariatrics

## 2021-11-07 ENCOUNTER — Other Ambulatory Visit: Payer: Self-pay

## 2021-11-07 ENCOUNTER — Other Ambulatory Visit (INDEPENDENT_AMBULATORY_CARE_PROVIDER_SITE_OTHER): Payer: Self-pay | Admitting: Bariatrics

## 2021-11-07 ENCOUNTER — Ambulatory Visit (INDEPENDENT_AMBULATORY_CARE_PROVIDER_SITE_OTHER): Payer: Medicaid Other | Admitting: Bariatrics

## 2021-11-07 VITALS — BP 128/83 | HR 82 | Temp 99.3°F | Ht 66.0 in | Wt 309.0 lb

## 2021-11-07 DIAGNOSIS — E1169 Type 2 diabetes mellitus with other specified complication: Secondary | ICD-10-CM

## 2021-11-07 DIAGNOSIS — Z6841 Body Mass Index (BMI) 40.0 and over, adult: Secondary | ICD-10-CM

## 2021-11-07 DIAGNOSIS — E669 Obesity, unspecified: Secondary | ICD-10-CM | POA: Diagnosis not present

## 2021-11-07 DIAGNOSIS — E559 Vitamin D deficiency, unspecified: Secondary | ICD-10-CM

## 2021-11-07 DIAGNOSIS — Z7984 Long term (current) use of oral hypoglycemic drugs: Secondary | ICD-10-CM

## 2021-11-07 MED ORDER — METFORMIN HCL 500 MG PO TABS: 500.0000 mg | ORAL_TABLET | Freq: Every day | ORAL | 0 refills | Status: DC

## 2021-11-07 MED ORDER — VITAMIN D (ERGOCALCIFEROL) 1.25 MG (50000 UNIT) PO CAPS: 50000.0000 [IU] | ORAL_CAPSULE | ORAL | 0 refills | Status: DC

## 2021-11-08 NOTE — Progress Notes (Signed)
? ? ? ?Chief Complaint:  ? ?OBESITY ?[name] is here to discuss her progress with her obesity treatment plan along with follow-up of her obesity related diagnoses. [name] is on the Category 3 Plan and states she is following her eating plan approximately 85% of the time. [name] states she is doing cardio, walking, and weight training for 90 minutes 4 times per week. ? ?Today's visit was #: 3 ?Starting weight: 312 lbs ?Starting date: 10/03/2021 ?Today's weight: 309 lbs ?Today's date: 11/07/2021 ?Total lbs lost to date: 3 lbs ?Total lbs lost since last in-office visit: 3 lbs ? ?Interim History: [name] is down another 3 lbs since her last visit. ? ?Subjective:  ? ?1. Diabetes mellitus type 2 in obese [facility]) ?[name] is currently taking Metformin.  ? ?2. Vitamin D deficiency ?[name] is taking Vitamin D currently.  ? ?Assessment/Plan:  ? ?1. Diabetes mellitus type 2 in obese [facility]) ?We will refill Metformin 500 mg for 1 month with no refills. Good blood sugar control is important to decrease the likelihood of diabetic complications such as nephropathy, neuropathy, limb loss, blindness, coronary artery disease, and death. Intensive lifestyle modification including diet, exercise and weight loss are the first line of treatment for diabetes.  ? ?- metFORMIN (GLUCOPHAGE) 500 MG tablet; Take 1 tablet (500 mg total) by mouth daily with supper.  Dispense: 30 tablet; Refill: 0 ? ?2. Vitamin D deficiency ?Low Vitamin D level contributes to fatigue and are associated with obesity, breast, and colon cancer. We will refill prescription Vitamin D 50,000 IU every week for 1 month with no refills and [name] will follow-up for routine testing of Vitamin D, at least 2-3 times per year to avoid over-replacement. ? ?- Vitamin D, Ergocalciferol, (DRISDOL) 1.25 MG (50000 UNIT) CAPS capsule; Take 1 capsule (50,000 Units total) by mouth every 7 (seven) days.  Dispense: 4 capsule; Refill: 0 ? ?3. Obesity, current BMI 49.9 ?[name] is currently  in the action stage of change. As such, her goal is to continue with weight loss efforts. She has agreed to the Category 3 Plan.  ? ?[name] will continue meal planning and she will continue intentional eating. She will increase her water intake. ? ?Exercise goals:  As is. ? ?Behavioral modification strategies: increasing lean protein intake, decreasing simple carbohydrates, increasing vegetables, increasing water intake, decreasing eating out, no skipping meals, meal planning and cooking strategies, keeping healthy foods in the home, and planning for success. ? ?[name] has agreed to follow-up with our clinic in 2 weeks. She was informed of the importance of frequent follow-up visits to maximize her success with intensive lifestyle modifications for her multiple health conditions.  ? ?Objective:  ? ?Blood pressure 128/83, pulse 82, temperature 99.3 ?F (37.4 ?C), height 5\' 6"  (1.676 m), weight (!) 309 lb (140.2 kg), SpO2 97 %. ?Body mass index is 49.87 kg/m?. ? ?General: Cooperative, alert, well developed, in no acute distress. ?HEENT: Conjunctivae and lids unremarkable. ?Cardiovascular: Regular rhythm.  ?Lungs: Normal work of breathing. ?Neurologic: No focal deficits.  ? ?Lab Results  ?Component Value Date  ? CREATININE 0.89 10/03/2021  ? BUN 12 10/03/2021  ? NA 140 10/03/2021  ? K 4.6 10/03/2021  ? CL 103 10/03/2021  ? CO2 22 10/03/2021  ? ?Lab Results  ?Component Value Date  ? ALT 19 10/03/2021  ? AST 14 10/03/2021  ? ALKPHOS 84 10/03/2021  ? BILITOT <0.2 10/03/2021  ? ?Lab Results  ?Component Value Date  ? HGBA1C 6.5 (H) 10/03/2021  ? HGBA1C 5.6  11/12/2017  ? ?Lab Results  ?Component Value Date  ? INSULIN 53.4 (H) 10/03/2021  ? ?Lab Results  ?Component Value Date  ? TSH 1.560 10/03/2021  ? ?Lab Results  ?Component Value Date  ? CHOL 182 10/03/2021  ? HDL 49 10/03/2021  ? LDLCALC 121 (H) 10/03/2021  ? TRIG 66 10/03/2021  ? ?Lab Results  ?Component Value Date  ? VD25OH 10.2 (L) 10/03/2021  ? ?Lab Results   ?Component Value Date  ? WBC 6.5 11/21/2020  ? HGB 13.7 11/21/2020  ? HCT 40.9 10/03/2021  ? MCV 86.7 11/21/2020  ? PLT 366 11/21/2020  ? ?Lab Results  ?Component Value Date  ? IRON 39 10/03/2021  ? TIBC 325 10/03/2021  ? FERRITIN 42 10/03/2021  ? ?Attestation Statements:  ? ?Reviewed by clinician on day of visit: allergies, medications, problem list, medical history, surgical history, family history, social history, and previous encounter notes. ? ?I, Jackson Latino, RMA, am acting as transcriptionist for [employer], DO. ? ?I have reviewed the above documentation for accuracy and completeness, and I agree with the above. Corinna Capra, DO ? ?

## 2021-11-11 ENCOUNTER — Encounter (INDEPENDENT_AMBULATORY_CARE_PROVIDER_SITE_OTHER): Payer: Self-pay | Admitting: Bariatrics

## 2021-11-22 ENCOUNTER — Ambulatory Visit (INDEPENDENT_AMBULATORY_CARE_PROVIDER_SITE_OTHER): Payer: Medicaid Other | Admitting: Family Medicine

## 2021-12-04 ENCOUNTER — Other Ambulatory Visit (INDEPENDENT_AMBULATORY_CARE_PROVIDER_SITE_OTHER): Payer: Self-pay | Admitting: Bariatrics

## 2021-12-04 DIAGNOSIS — E559 Vitamin D deficiency, unspecified: Secondary | ICD-10-CM

## 2021-12-10 ENCOUNTER — Ambulatory Visit (INDEPENDENT_AMBULATORY_CARE_PROVIDER_SITE_OTHER): Payer: Medicaid Other | Admitting: Nurse Practitioner

## 2021-12-10 ENCOUNTER — Encounter (INDEPENDENT_AMBULATORY_CARE_PROVIDER_SITE_OTHER): Payer: Self-pay | Admitting: Nurse Practitioner

## 2021-12-10 VITALS — BP 120/80 | Ht 66.0 in | Wt 307.0 lb

## 2021-12-10 DIAGNOSIS — Z7985 Long-term (current) use of injectable non-insulin antidiabetic drugs: Secondary | ICD-10-CM | POA: Diagnosis not present

## 2021-12-10 DIAGNOSIS — R030 Elevated blood-pressure reading, without diagnosis of hypertension: Secondary | ICD-10-CM | POA: Diagnosis not present

## 2021-12-10 DIAGNOSIS — E1169 Type 2 diabetes mellitus with other specified complication: Secondary | ICD-10-CM | POA: Diagnosis not present

## 2021-12-10 DIAGNOSIS — Z6841 Body Mass Index (BMI) 40.0 and over, adult: Secondary | ICD-10-CM

## 2021-12-10 DIAGNOSIS — E559 Vitamin D deficiency, unspecified: Secondary | ICD-10-CM | POA: Diagnosis not present

## 2021-12-10 DIAGNOSIS — E669 Obesity, unspecified: Secondary | ICD-10-CM | POA: Diagnosis not present

## 2021-12-10 MED ORDER — VITAMIN D (ERGOCALCIFEROL) 1.25 MG (50000 UNIT) PO CAPS: 50000.0000 [IU] | ORAL_CAPSULE | ORAL | 0 refills | Status: DC

## 2021-12-10 MED ORDER — METFORMIN HCL 500 MG PO TABS: 500.0000 mg | ORAL_TABLET | Freq: Every day | ORAL | 0 refills | Status: DC

## 2021-12-11 NOTE — Progress Notes (Signed)
? ? ? ?Chief Complaint:  ? ?OBESITY ?[name] is here to discuss her progress with her obesity treatment plan along with follow-up of her obesity related diagnoses. [name] is on the Category 3 Plan and states she is following her eating plan approximately 50% of the time. [name] states she is using the [facility] system -just dance 90 minutes 4 times per week. ? ?Today's visit was #: 4 ?Starting weight: 312 lbs ?Starting date: 10/03/2021 ?Today's weight: 307 lbs ?Today's date: 12/10/2021 ?Total lbs lost to date: 5 ?Total lbs lost since last in-office visit: 2 ? ?Interim History: [name] has done well with weight loss. She just came home from a 2 week trip to [facility]. She is drinking water, Crystal light and Sparkling water. She is currently fasting for 1 month, sunrise to sundown for Ramadan.  Not meeting all protein or calories daily. [name] is struggling with cravings. ? ?Subjective:  ? ?1. Diabetes mellitus type 2 in obese [facility]) ?[name] last A1C was at 6.5. She is currently taking Metformin 500mg . Denies any side effects. She is not on any ACE or Statin. ? ?2. Vitamin D deficiency ?[name] last Vit D was at 10.2. She is taking Vit D 50k weekly. Denies any nausea, vomiting or muscle weakness. ? ?3. Elevated blood pressure reading ?[name] is not currently on any medications. Denies chest pain and palpitations. ? ?Assessment/Plan:  ? ?1. Diabetes mellitus type 2 in obese [facility]) ?We will refill Metformin for 1 month with no refills. ? ?-Refill metFORMIN (GLUCOPHAGE) 500 MG tablet; Take 1 tablet (500 mg total) by mouth daily with supper.  Dispense: 30 tablet; Refill: 0 ? ?2. Vitamin D deficiency ?We will refill Vit D 50k for 1 month with no refills. ? ?- Refill Vitamin D, Ergocalciferol, (DRISDOL) 1.25 MG (50000 UNIT) CAPS capsule; Take 1 capsule (50,000 Units total) by mouth every 7 (seven) days.  Dispense: 4 capsule; Refill: 0 ? ?3. Elevated blood pressure reading ?We discussed adding on ACE today. [name] would like  to hold off to re-discuss at next visit. ? ?4. Obesity, current BMI 49.7 ?[name] is currently in the action stage of change. As such, her goal is to continue with weight loss efforts. She has agreed to the Category 3 Plan.  ? ?Exercise goals: As is. ? ?Behavioral modification strategies: increasing lean protein intake and increasing water intake. ? ?[name] has agreed to follow-up with our clinic in 2 weeks. She was informed of the importance of frequent follow-up visits to maximize her success with intensive lifestyle modifications for her multiple health conditions.  ? ?Objective:  ? ?Blood pressure 120/80, height 5\' 6"  (1.676 m), weight (!) 307 lb (139.3 kg). ?Body mass index is 49.55 kg/m?. ? ?General: Cooperative, alert, well developed, in no acute distress. ?HEENT: Conjunctivae and lids unremarkable. ?Cardiovascular: Regular rhythm.  ?Lungs: Normal work of breathing. ?Neurologic: No focal deficits.  ? ?Lab Results  ?Component Value Date  ? CREATININE 0.89 10/03/2021  ? BUN 12 10/03/2021  ? NA 140 10/03/2021  ? K 4.6 10/03/2021  ? CL 103 10/03/2021  ? CO2 22 10/03/2021  ? ?Lab Results  ?Component Value Date  ? ALT 19 10/03/2021  ? AST 14 10/03/2021  ? ALKPHOS 84 10/03/2021  ? BILITOT <0.2 10/03/2021  ? ?Lab Results  ?Component Value Date  ? HGBA1C 6.5 (H) 10/03/2021  ? HGBA1C 5.6 11/12/2017  ? ?Lab Results  ?Component Value Date  ? INSULIN 53.4 (H) 10/03/2021  ? ?Lab Results  ?Component Value Date  ?  TSH 1.560 10/03/2021  ? ?Lab Results  ?Component Value Date  ? CHOL 182 10/03/2021  ? HDL 49 10/03/2021  ? LDLCALC 121 (H) 10/03/2021  ? TRIG 66 10/03/2021  ? ?Lab Results  ?Component Value Date  ? VD25OH 10.2 (L) 10/03/2021  ? ?Lab Results  ?Component Value Date  ? WBC 6.5 11/21/2020  ? HGB 13.7 11/21/2020  ? HCT 40.9 10/03/2021  ? MCV 86.7 11/21/2020  ? PLT 366 11/21/2020  ? ?Lab Results  ?Component Value Date  ? IRON 39 10/03/2021  ? TIBC 325 10/03/2021  ? FERRITIN 42 10/03/2021  ? ?Attestation Statements:   ? ?Reviewed by clinician on day of visit: allergies, medications, problem list, medical history, surgical history, family history, social history, and previous encounter notes. ? ?I, Brendell Tyus, am acting as transcriptionist for Irene Limbo, FNP. ? ?I have reviewed the above documentation for accuracy and completeness, and I agree with the above. Irene Limbo, FNP  ?

## 2021-12-31 ENCOUNTER — Ambulatory Visit: Payer: Medicaid Other | Admitting: Medical

## 2021-12-31 ENCOUNTER — Ambulatory Visit (INDEPENDENT_AMBULATORY_CARE_PROVIDER_SITE_OTHER): Payer: Medicaid Other | Admitting: Physician Assistant

## 2021-12-31 ENCOUNTER — Other Ambulatory Visit (INDEPENDENT_AMBULATORY_CARE_PROVIDER_SITE_OTHER): Payer: Self-pay | Admitting: Physician Assistant

## 2021-12-31 ENCOUNTER — Encounter (INDEPENDENT_AMBULATORY_CARE_PROVIDER_SITE_OTHER): Payer: Self-pay | Admitting: Physician Assistant

## 2021-12-31 VITALS — BP 120/80 | Ht 65.0 in | Wt 309.0 lb

## 2021-12-31 DIAGNOSIS — E559 Vitamin D deficiency, unspecified: Secondary | ICD-10-CM

## 2021-12-31 DIAGNOSIS — E1169 Type 2 diabetes mellitus with other specified complication: Secondary | ICD-10-CM

## 2021-12-31 DIAGNOSIS — Z6841 Body Mass Index (BMI) 40.0 and over, adult: Secondary | ICD-10-CM | POA: Diagnosis not present

## 2021-12-31 DIAGNOSIS — E669 Obesity, unspecified: Secondary | ICD-10-CM | POA: Diagnosis not present

## 2021-12-31 DIAGNOSIS — Z7984 Long term (current) use of oral hypoglycemic drugs: Secondary | ICD-10-CM | POA: Diagnosis not present

## 2021-12-31 MED ORDER — VITAMIN D (ERGOCALCIFEROL) 1.25 MG (50000 UNIT) PO CAPS: 50000.0000 [IU] | ORAL_CAPSULE | ORAL | 0 refills | Status: DC

## 2021-12-31 MED ORDER — METFORMIN HCL 500 MG PO TABS: 500.0000 mg | ORAL_TABLET | Freq: Every day | ORAL | 0 refills | Status: DC

## 2022-01-02 ENCOUNTER — Other Ambulatory Visit (INDEPENDENT_AMBULATORY_CARE_PROVIDER_SITE_OTHER): Payer: Self-pay | Admitting: Physician Assistant

## 2022-01-02 DIAGNOSIS — E1169 Type 2 diabetes mellitus with other specified complication: Secondary | ICD-10-CM

## 2022-01-06 NOTE — Progress Notes (Signed)
? ? ? ?Chief Complaint:  ? ?OBESITY ?[name] is here to discuss her progress with her obesity treatment plan along with follow-up of her obesity related diagnoses. [name] is on the Category 3 Plan and states she is following her eating plan approximately 100% of the time. [name] states she is doing WII fit for 30 minutes 4 times per week. ? ?Today's visit was #: 5 ?Starting weight: 312 lbs ?Starting date: 10/03/2021 ?Today's weight: 309 lbs ?Today's date: 12/31/2021 ?Total lbs lost to date: 3 ?Total lbs lost since last in-office visit: 0 ? ?Interim History: [name] is drinking protein shakes for breakfast and eating spinach and egg wraps for lunch. Dinner is not an issue. She is not eating enough food in general, but she is trying to adjust now that Ramadan is over.  ? ?Subjective:  ? ?1. Vitamin D deficiency ?Ragen's last Vitamin D level 10.2. She takes Vitamin D weekly as prescribed.  ? ?2. Diabetes mellitus type 2 in obese [facility]) ?Lasonia is on metformin. Her last A1c was 6.5.  ? ?Assessment/Plan:  ? ?1. Vitamin D deficiency ?[name] will continue prescription Vitamin D weekly, and we will refill for 1 month. ? ?- Vitamin D, Ergocalciferol, (DRISDOL) 1.25 MG (50000 UNIT) CAPS capsule; Take 1 capsule (50,000 Units total) by mouth every 7 (seven) days.  Dispense: 4 capsule; Refill: 0 ? ?2. Diabetes mellitus type 2 in obese [facility]) ?[name] will continue metformin and we will refill for 1 month. ? ?- metFORMIN (GLUCOPHAGE) 500 MG tablet; Take 1 tablet (500 mg total) by mouth daily with supper.  Dispense: 30 tablet; Refill: 0 ? ?3. Obesity, current BMI 49.7 ?[name] is currently in the action stage of change. As such, her goal is to continue with weight loss efforts. She has agreed to the Category 3 Plan.  ? ?Exercise goals: As is. ? ?Behavioral modification strategies: increasing lean protein intake, no skipping meals, and meal planning and cooking strategies. ? ?[name] has agreed to follow-up with our clinic in 3  weeks. She was informed of the importance of frequent follow-up visits to maximize her success with intensive lifestyle modifications for her multiple health conditions.  ? ?Objective:  ? ?Blood pressure 120/80, height 5\' 5"  (1.651 m), weight (!) 309 lb (140.2 kg). ?Body mass index is 51.42 kg/m?. ? ?General: Cooperative, alert, well developed, in no acute distress. ?HEENT: Conjunctivae and lids unremarkable. ?Cardiovascular: Regular rhythm.  ?Lungs: Normal work of breathing. ?Neurologic: No focal deficits.  ? ?Lab Results  ?Component Value Date  ? CREATININE 0.89 10/03/2021  ? BUN 12 10/03/2021  ? NA 140 10/03/2021  ? K 4.6 10/03/2021  ? CL 103 10/03/2021  ? CO2 22 10/03/2021  ? ?Lab Results  ?Component Value Date  ? ALT 19 10/03/2021  ? AST 14 10/03/2021  ? ALKPHOS 84 10/03/2021  ? BILITOT <0.2 10/03/2021  ? ?Lab Results  ?Component Value Date  ? HGBA1C 6.5 (H) 10/03/2021  ? HGBA1C 5.6 11/12/2017  ? ?Lab Results  ?Component Value Date  ? INSULIN 53.4 (H) 10/03/2021  ? ?Lab Results  ?Component Value Date  ? TSH 1.560 10/03/2021  ? ?Lab Results  ?Component Value Date  ? CHOL 182 10/03/2021  ? HDL 49 10/03/2021  ? LDLCALC 121 (H) 10/03/2021  ? TRIG 66 10/03/2021  ? ?Lab Results  ?Component Value Date  ? VD25OH 10.2 (L) 10/03/2021  ? ?Lab Results  ?Component Value Date  ? WBC 6.5 11/21/2020  ? HGB 13.7 11/21/2020  ? HCT 40.9 10/03/2021  ?  MCV 86.7 11/21/2020  ? PLT 366 11/21/2020  ? ?Lab Results  ?Component Value Date  ? IRON 39 10/03/2021  ? TIBC 325 10/03/2021  ? FERRITIN 42 10/03/2021  ? ?Attestation Statements:  ? ?Reviewed by clinician on day of visit: allergies, medications, problem list, medical history, surgical history, family history, social history, and previous encounter notes. ? ? ?I, Burt Knack, am acting as transcriptionist for Alois Cliche, PA-C. ? ?I have reviewed the above documentation for accuracy and completeness, and I agree with the above. Alois Cliche, PA-C ? ? ?

## 2022-01-25 ENCOUNTER — Other Ambulatory Visit (INDEPENDENT_AMBULATORY_CARE_PROVIDER_SITE_OTHER): Payer: Self-pay | Admitting: Physician Assistant

## 2022-01-25 DIAGNOSIS — E559 Vitamin D deficiency, unspecified: Secondary | ICD-10-CM

## 2022-01-28 ENCOUNTER — Encounter (INDEPENDENT_AMBULATORY_CARE_PROVIDER_SITE_OTHER): Payer: Self-pay | Admitting: Nurse Practitioner

## 2022-01-28 ENCOUNTER — Ambulatory Visit (INDEPENDENT_AMBULATORY_CARE_PROVIDER_SITE_OTHER): Payer: Medicaid Other | Admitting: Nurse Practitioner

## 2022-01-28 VITALS — BP 118/77 | HR 93 | Temp 98.2°F | Ht 65.0 in | Wt 311.0 lb

## 2022-01-28 DIAGNOSIS — E669 Obesity, unspecified: Secondary | ICD-10-CM

## 2022-01-28 DIAGNOSIS — Z7984 Long term (current) use of oral hypoglycemic drugs: Secondary | ICD-10-CM

## 2022-01-28 DIAGNOSIS — E785 Hyperlipidemia, unspecified: Secondary | ICD-10-CM

## 2022-01-28 DIAGNOSIS — E1169 Type 2 diabetes mellitus with other specified complication: Secondary | ICD-10-CM

## 2022-01-28 DIAGNOSIS — E559 Vitamin D deficiency, unspecified: Secondary | ICD-10-CM | POA: Diagnosis not present

## 2022-01-28 DIAGNOSIS — Z6841 Body Mass Index (BMI) 40.0 and over, adult: Secondary | ICD-10-CM | POA: Diagnosis not present

## 2022-01-28 MED ORDER — VITAMIN D (ERGOCALCIFEROL) 1.25 MG (50000 UNIT) PO CAPS: 50000.0000 [IU] | ORAL_CAPSULE | ORAL | 0 refills | Status: DC

## 2022-01-28 MED ORDER — METFORMIN HCL 500 MG PO TABS: 500.0000 mg | ORAL_TABLET | Freq: Every day | ORAL | 0 refills | Status: DC

## 2022-01-29 LAB — COMPREHENSIVE METABOLIC PANEL
ALT: 19 IU/L (ref 0–32)
AST: 15 IU/L (ref 0–40)
Albumin/Globulin Ratio: 1.4 (ref 1.2–2.2)
Albumin: 4.3 g/dL (ref 3.9–5.0)
Alkaline Phosphatase: 78 IU/L (ref 44–121)
BUN/Creatinine Ratio: 11 (ref 9–23)
BUN: 10 mg/dL (ref 6–20)
Bilirubin Total: 0.3 mg/dL (ref 0.0–1.2)
CO2: 21 mmol/L (ref 20–29)
Calcium: 9.2 mg/dL (ref 8.7–10.2)
Chloride: 103 mmol/L (ref 96–106)
Creatinine, Ser: 0.92 mg/dL (ref 0.57–1.00)
Globulin, Total: 3.1 g/dL (ref 1.5–4.5)
Glucose: 103 mg/dL — ABNORMAL HIGH (ref 70–99)
Potassium: 4.4 mmol/L (ref 3.5–5.2)
Sodium: 139 mmol/L (ref 134–144)
Total Protein: 7.4 g/dL (ref 6.0–8.5)
eGFR: 87 mL/min/{1.73_m2} (ref >59)

## 2022-01-29 LAB — HEMOGLOBIN A1C
Est. average glucose Bld gHb Est-mCnc: 131 mg/dL
Hgb A1c MFr Bld: 6.2 % — ABNORMAL HIGH (ref 4.8–5.6)

## 2022-01-29 LAB — LIPID PANEL WITH LDL/HDL RATIO
Cholesterol, Total: 200 mg/dL — ABNORMAL HIGH (ref 100–199)
HDL: 54 mg/dL (ref >39)
LDL Chol Calc (NIH): 131 mg/dL — ABNORMAL HIGH (ref 0–99)
LDL/HDL Ratio: 2.4 ratio (ref 0.0–3.2)
Triglycerides: 83 mg/dL (ref 0–149)
VLDL Cholesterol Cal: 15 mg/dL (ref 5–40)

## 2022-01-29 LAB — INSULIN, RANDOM: INSULIN: 46 u[IU]/mL — ABNORMAL HIGH (ref 2.6–24.9)

## 2022-01-29 LAB — VITAMIN D 25 HYDROXY (VIT D DEFICIENCY, FRACTURES): Vit D, 25-Hydroxy: 22.2 ng/mL — ABNORMAL LOW (ref 30.0–100.0)

## 2022-01-29 NOTE — Progress Notes (Unsigned)
Chief Complaint:   OBESITY [name] is here to discuss her progress with her obesity treatment plan along with follow-up of her obesity related diagnoses. [name] is on the Category 3 Plan and states she is following her eating plan approximately 70% of the time. [name] states she is doing WII just dance for 45-60 minutes 3 times per week.  Today's visit was #: 6 Starting weight: 312 lbs Starting date: 10/03/2021 Today's weight: 311 lbs Today's date: 01/28/2022 Total lbs lost to date: 1 lb Total lbs lost since last in-office visit: 0  Interim History: [name] was shocked with weight gain today. She celebrated her sister's birthday since her last visit. She is skipping meals. She is not meeting calories or protein goal. She started IF 12:00-9 pm since her last visit. She is drinking water, sparkling water, and sometimes coffee.   Subjective:   1. Vitamin D deficiency [name]'s last Vitamin D was 10.2. She denies nausea, vomiting, and muscle weakness.   2. Diabetes mellitus type 2 in obese (HCC) [name]'s last A1C was 6.5. She is taking Metformin 500 mg daily. She denies side effectgs.   3. Hyperlipidemia, unspecified hyperlipidemia type [name] has never been on medications.   Assessment/Plan:   1. Vitamin D deficiency Low Vitamin D level contributes to fatigue and are associated with obesity, breast, and colon cancer. We will refill prescription Vitamin D 50,000 IU every week for 1 month with no refills and she will follow-up for routine testing of Vitamin D, at least 2-3 times per year to avoid over-replacement. We discussed side effects today. Labs obtained today.   - Vitamin D, Ergocalciferol, (DRISDOL) 1.25 MG (50000 UNIT) CAPS capsule; Take 1 capsule (50,000 Units total) by mouth every 7 (seven) days.  Dispense: 4 capsule; Refill: 0 - Comprehensive metabolic panel - VITAMIN D 25 Hydroxy (Vit-D Deficiency, Fractures)  2. Diabetes mellitus type 2 in obese (HCC) We will  refill Metformin 500 mg daily. We discussed side effects. Labs obtained today. Good blood sugar control is important to decrease the likelihood of diabetic complications such as nephropathy, neuropathy, limb loss, blindness, coronary artery disease, and death. Intensive lifestyle modification including diet, exercise and weight loss are the first line of treatment for diabetes.   - metFORMIN (GLUCOPHAGE) 500 MG tablet; Take 1 tablet (500 mg total) by mouth daily with supper.  Dispense: 30 tablet; Refill: 0 - Comprehensive metabolic panel - Hemoglobin A1c - Insulin, random  3. Hyperlipidemia, unspecified hyperlipidemia type Cardiovascular risk and specific lipid/LDL goals reviewed.  We discussed several lifestyle modifications today and [name] will continue to work on diet, exercise and weight loss efforts. Labs obtained today. Orders and follow up as documented in patient record.   Counseling Intensive lifestyle modifications are the first line treatment for this issue. Dietary changes: Increase soluble fiber. Decrease simple carbohydrates. Exercise changes: Moderate to vigorous-intensity aerobic activity 150 minutes per week if tolerated. Lipid-lowering medications: see documented in medical record.  - Lipid Panel With LDL/HDL Ratio  4. Obesity, current BMI 51.8 [name] is currently in the action stage of change. As such, her goal is to continue with weight loss efforts. She has agreed to the Category 3 Plan.   Exercise goals:  As is.   Behavioral modification strategies: increasing lean protein intake, increasing vegetables, increasing water intake, no skipping meals, and planning for success.  [name] has agreed to follow-up with our clinic in 4 weeks. She was informed of the importance of frequent follow-up visits to maximize  her success with intensive lifestyle modifications for her multiple health conditions.   Objective:   Blood pressure 118/77, pulse 93, temperature 98.2 F  (36.8 C), height 5\' 5"  (1.651 m), weight (!) 311 lb (141.1 kg), SpO2 96 %. Body mass index is 51.75 kg/m.  General: Cooperative, alert, well developed, in no acute distress. HEENT: Conjunctivae and lids unremarkable. Cardiovascular: Regular rhythm.  Lungs: Normal work of breathing. Neurologic: No focal deficits.   Lab Results  Component Value Date   CREATININE 0.92 01/28/2022   BUN 10 01/28/2022   NA 139 01/28/2022   K 4.4 01/28/2022   CL 103 01/28/2022   CO2 21 01/28/2022   Lab Results  Component Value Date   ALT 19 01/28/2022   AST 15 01/28/2022   ALKPHOS 78 01/28/2022   BILITOT 0.3 01/28/2022   Lab Results  Component Value Date   HGBA1C 6.2 (H) 01/28/2022   HGBA1C 6.5 (H) 10/03/2021   HGBA1C 5.6 11/12/2017   Lab Results  Component Value Date   INSULIN 46.0 (H) 01/28/2022   INSULIN 53.4 (H) 10/03/2021   Lab Results  Component Value Date   TSH 1.560 10/03/2021   Lab Results  Component Value Date   CHOL 200 (H) 01/28/2022   HDL 54 01/28/2022   LDLCALC 131 (H) 01/28/2022   TRIG 83 01/28/2022   Lab Results  Component Value Date   VD25OH 22.2 (L) 01/28/2022   VD25OH 10.2 (L) 10/03/2021   Lab Results  Component Value Date   WBC 6.5 11/21/2020   HGB 13.7 11/21/2020   HCT 40.9 10/03/2021   MCV 86.7 11/21/2020   PLT 366 11/21/2020   Lab Results  Component Value Date   IRON 39 10/03/2021   TIBC 325 10/03/2021   FERRITIN 42 10/03/2021   Attestation Statements:   Reviewed by clinician on day of visit: allergies, medications, problem list, medical history, surgical history, family history, social history, and previous encounter notes.  I, 10/05/2021, RMA, am acting as [name] for Energy manager, FNP.   I have reviewed the above documentation for accuracy and completeness, and I agree with the above. Sara Limbo, FNP

## 2022-02-18 ENCOUNTER — Ambulatory Visit (INDEPENDENT_AMBULATORY_CARE_PROVIDER_SITE_OTHER): Payer: Medicaid Other | Admitting: Nurse Practitioner

## 2022-02-25 IMAGING — US US TRANSVAGINAL NON-OB
1 series · 14 of 25 positions shown · non-contrast
Comparison: None

CLINICAL DATA: Vaginal bleeding.

EXAM:
TRANSABDOMINAL AND TRANSVAGINAL ULTRASOUND OF PELVIS
TECHNIQUE: Both transabdominal and transvaginal ultrasound examinations of the
pelvis were performed. Transabdominal technique was performed for
global imaging of the pelvis including uterus, ovaries, adnexal
regions, and pelvic cul-de-sac. It was necessary to proceed with
endovaginal exam following the transabdominal exam to visualize the
uterus, endometrium, bilateral ovaries and bilateral adnexa.

[Series 1: us transvaginal non-ob · 14 of 67 slices shown]
[im 1/67]
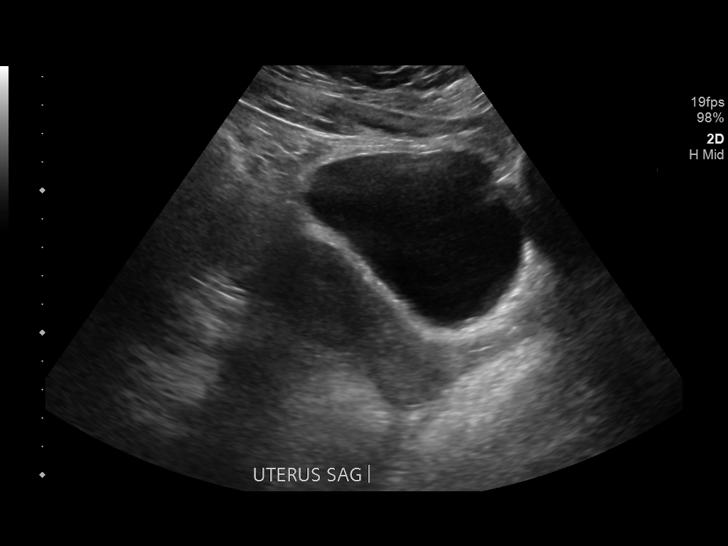
[im 6/67]
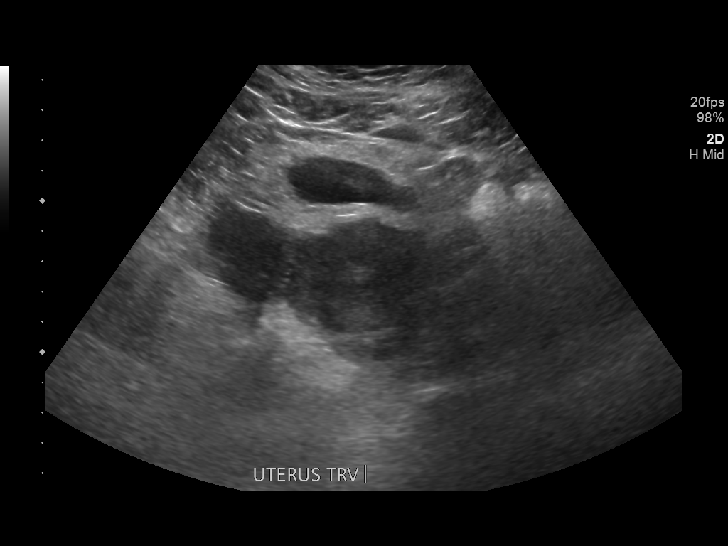
[im 12/67]
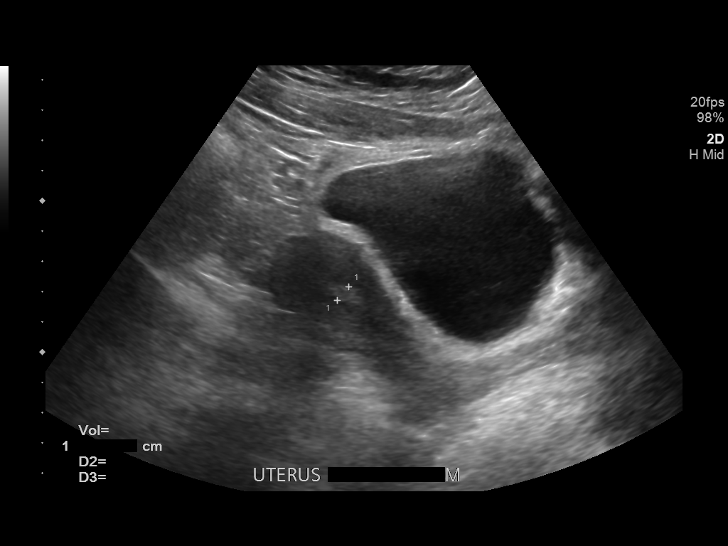
[im 17/67]
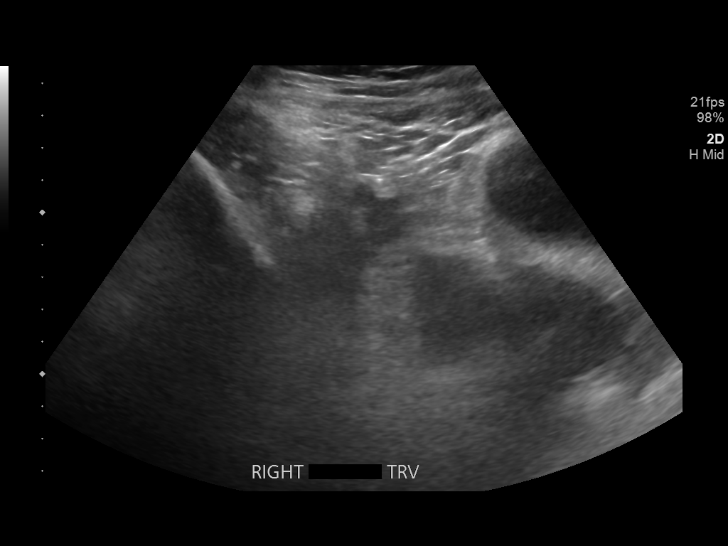
[im 23/67]
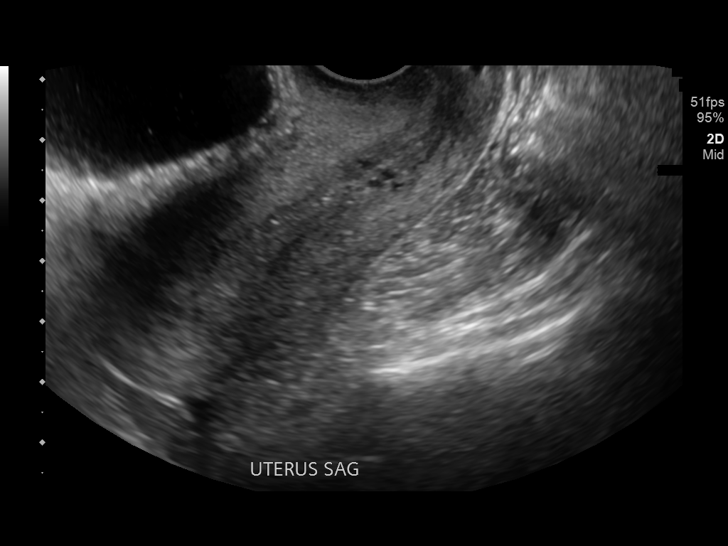
[im 25/67]
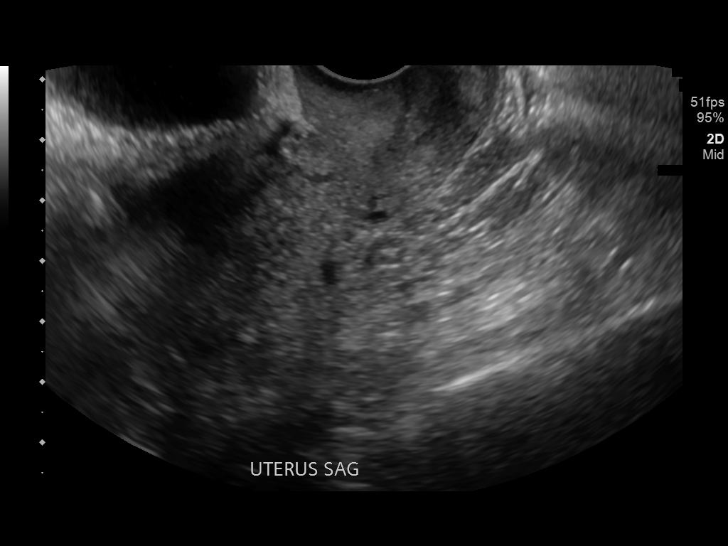
[im 31/67]
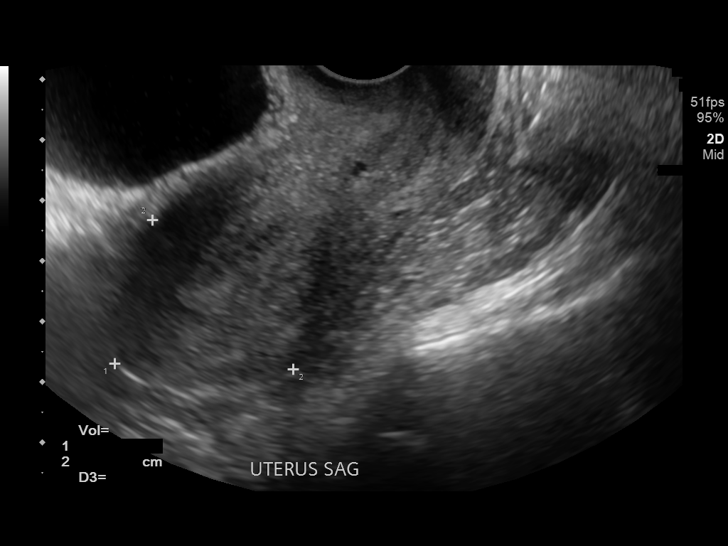
[im 36/67]
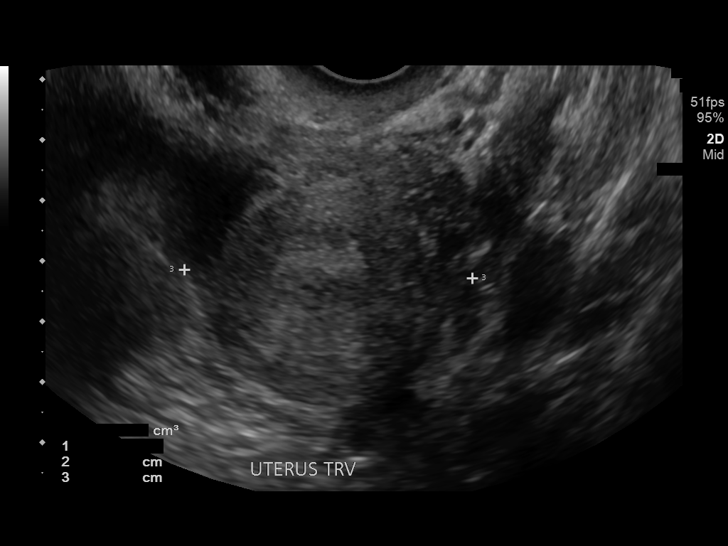
[im 42/67]
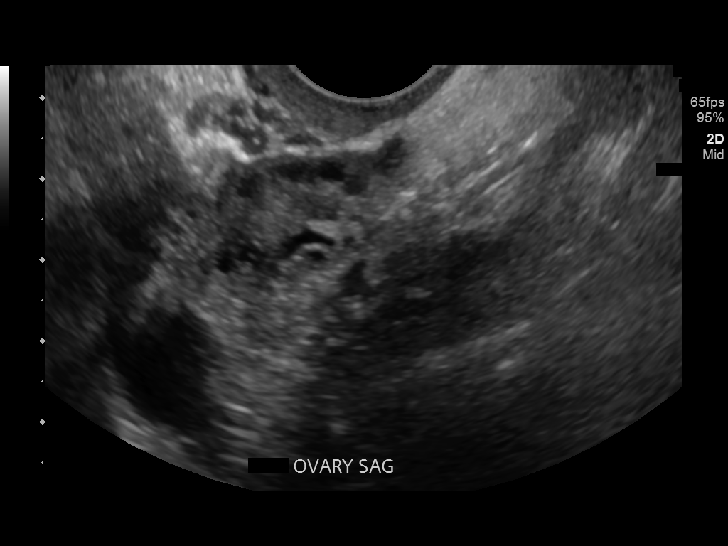
[im 45/67]
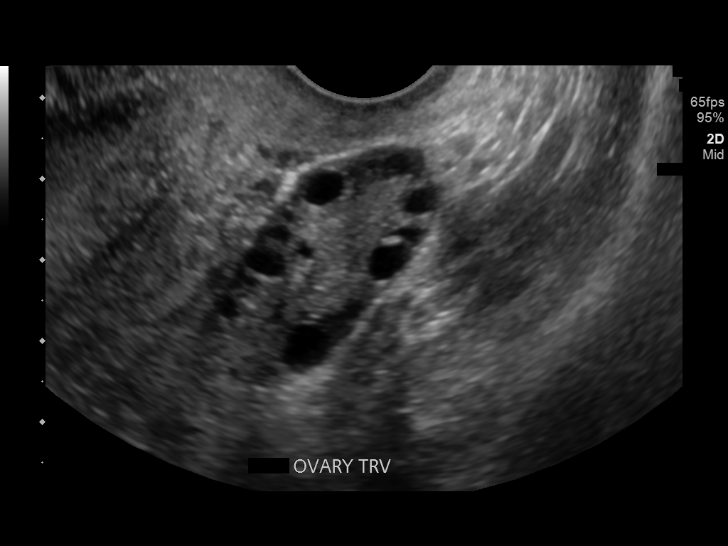
[im 50/67]
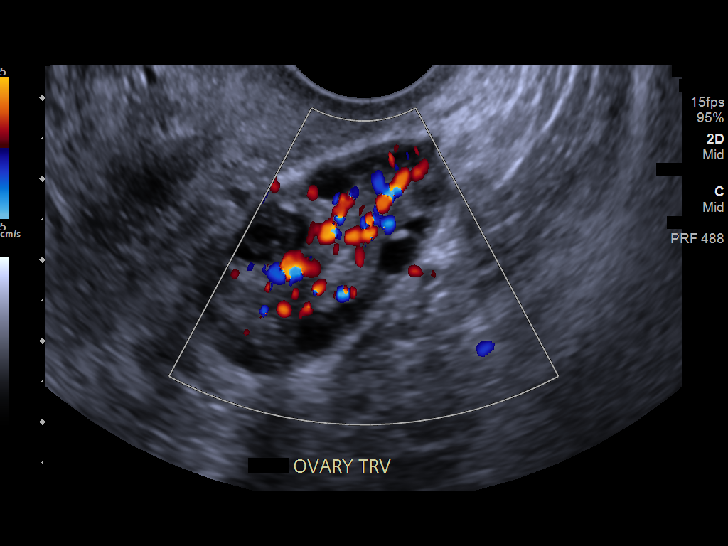
[im 56/67]
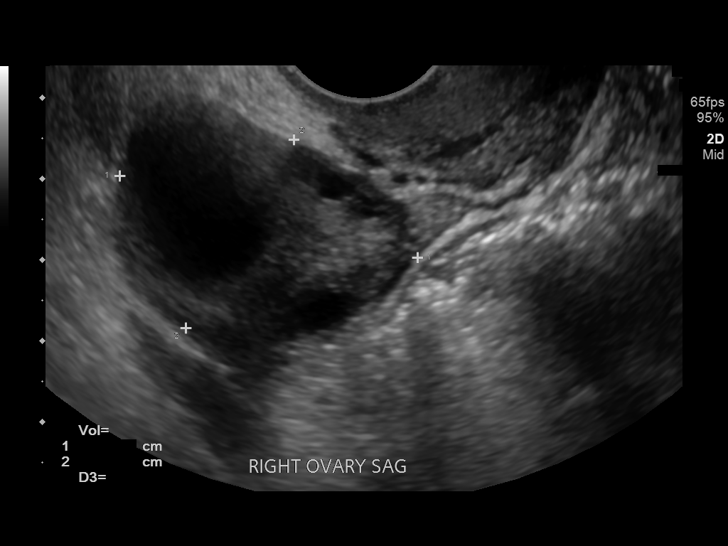
[im 61/67]
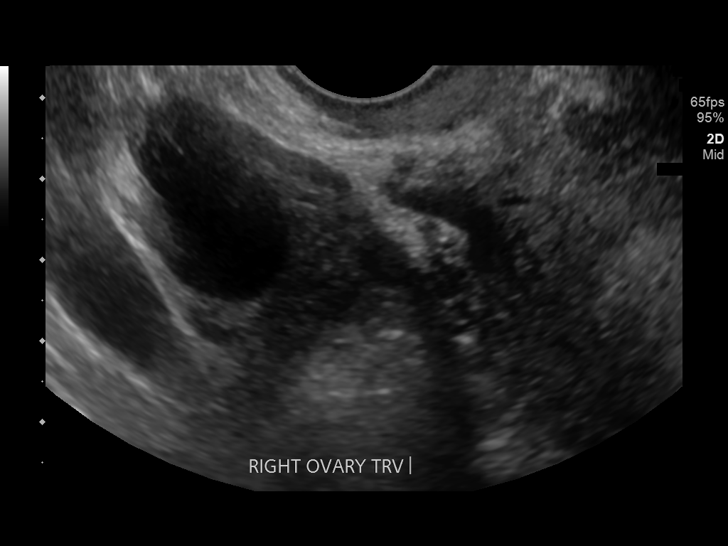
[im 67/67]
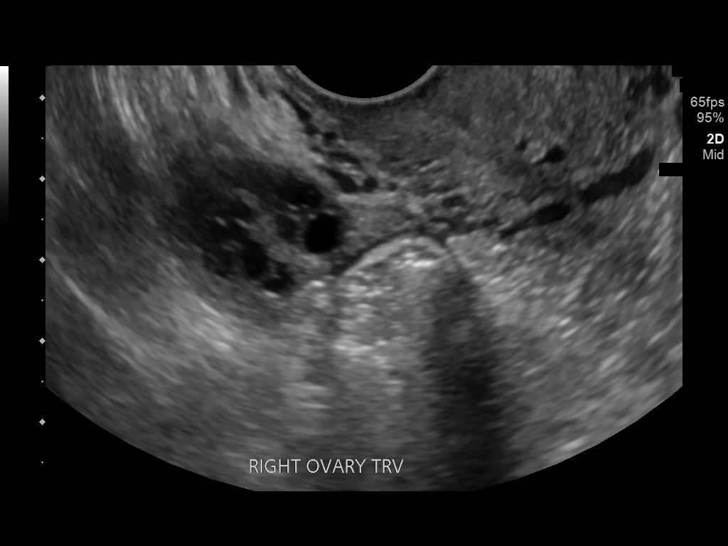

[14 of 25 positions shown; findings below may reference images not displayed]

FINDINGS: Uterus

Measurements: 8.2 cm x 3.4 cm x 4.8 cm = volume: 69.0 mL. No
fibroids or other mass visualized.

Endometrium

Thickness: 5.4 mm.  No focal abnormality visualized.

Right ovary

Measurements: 3.8 cm x 2.7 cm x 2.8 cm = volume: 14.8 mL. Normal
appearance/no adnexal mass.

Left ovary

Measurements: 3.5 cm x 2.2 cm x 2.6 cm = volume: 10.4 mL. Normal
appearance/no adnexal mass.

Other findings

A trace amount of pelvic free fluid is seen.
IMPRESSION: 1. Trace amount of pelvic free fluid which may be physiologic in
nature.
2. Otherwise, normal pelvic ultrasound.

## 2022-02-25 IMAGING — US US PELVIS COMPLETE
1 series · 14 of 25 positions shown · non-contrast
Comparison: None

CLINICAL DATA: Vaginal bleeding x3 weeks.

EXAM:
TRANSABDOMINAL AND TRANSVAGINAL ULTRASOUND OF PELVIS
TECHNIQUE: Both transabdominal and transvaginal ultrasound examinations of the
pelvis were performed. Transabdominal technique was performed for
global imaging of the pelvis including uterus, ovaries, adnexal
regions, and pelvic cul-de-sac. It was necessary to proceed with
endovaginal exam following the transabdominal exam to visualize the
uterus, endometrium, bilateral ovaries and bilateral adnexa.

[Series 1: us pelvis complete · 14 of 67 slices shown]
[im 1/67]
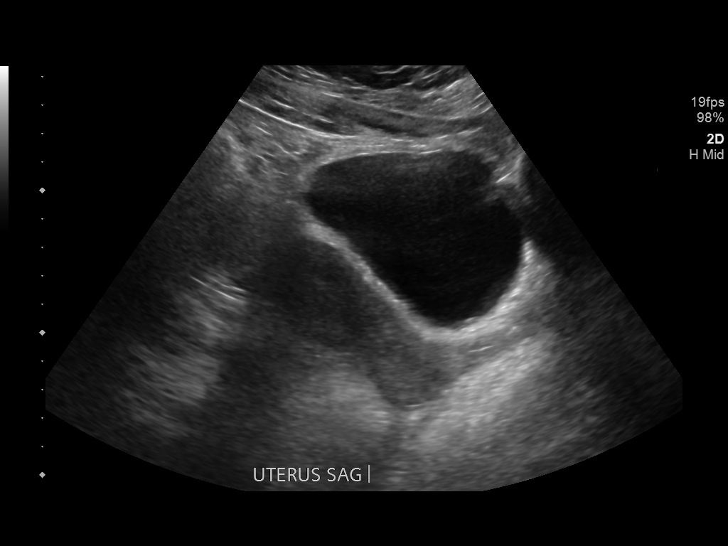
[im 6/67]
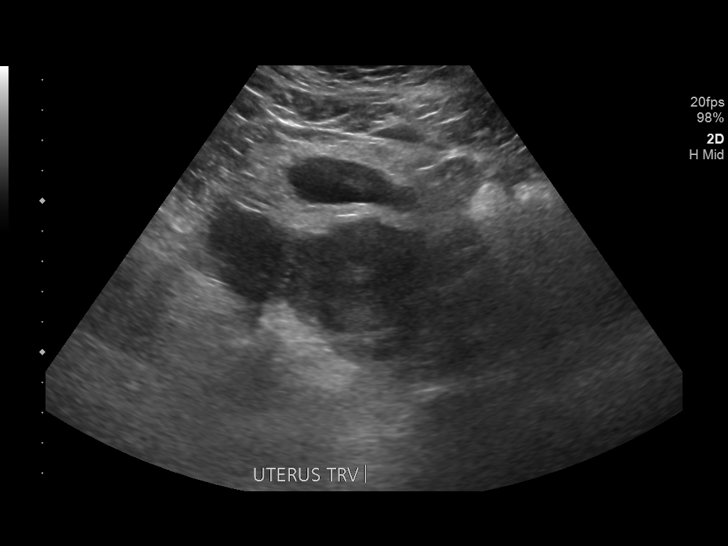
[im 12/67]
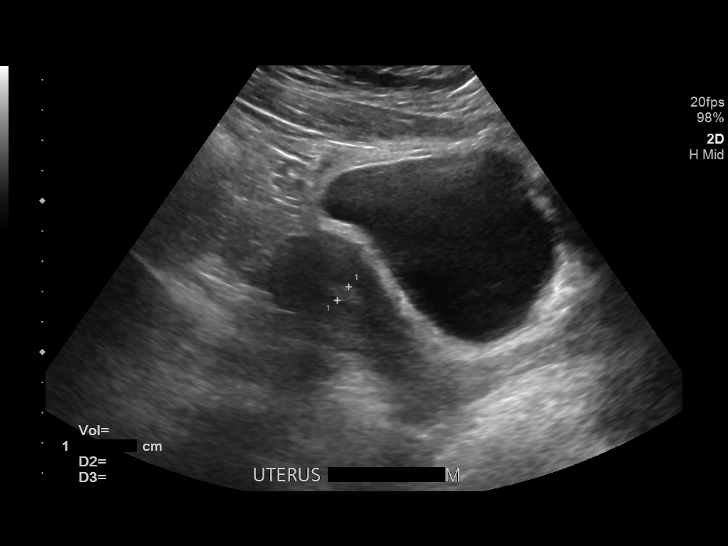
[im 17/67]
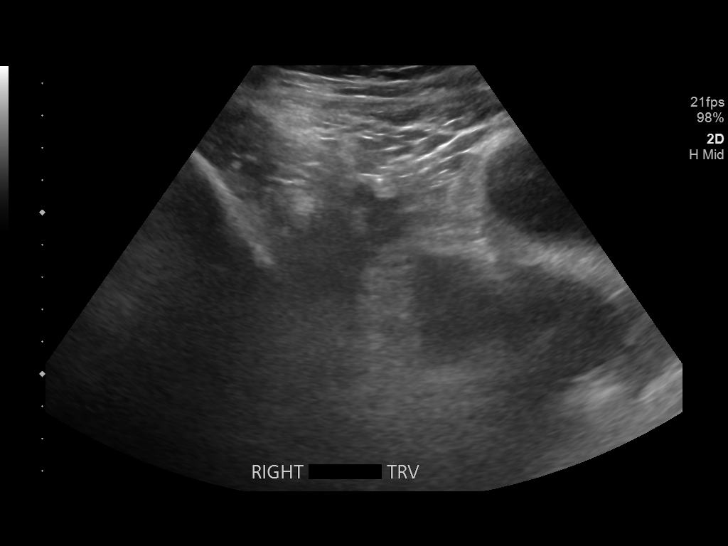
[im 23/67]
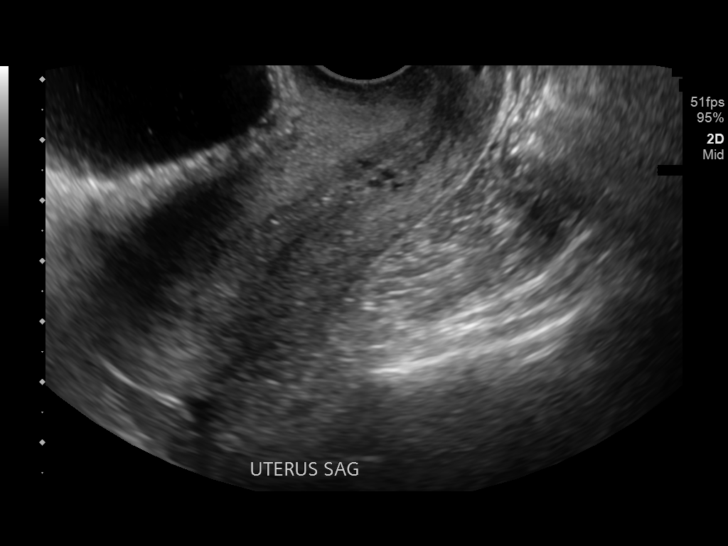
[im 25/67]
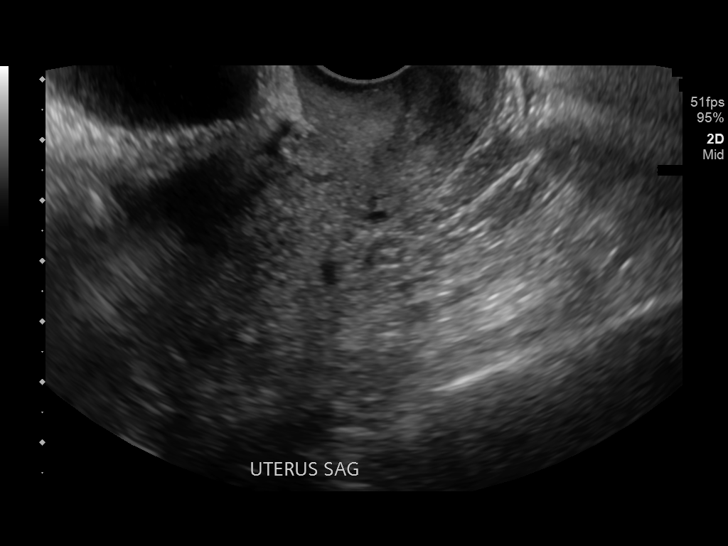
[im 31/67]
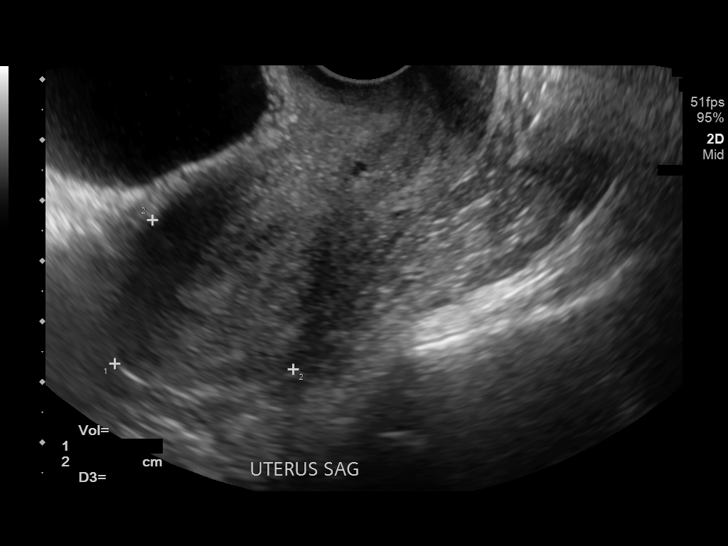
[im 36/67]
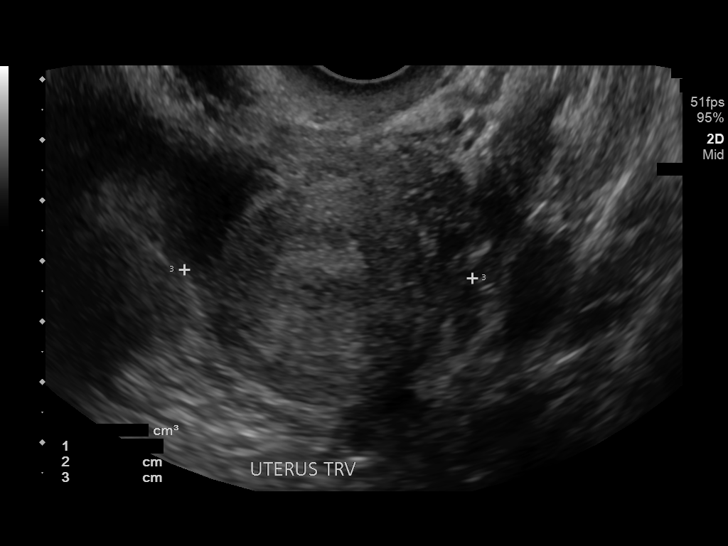
[im 42/67]
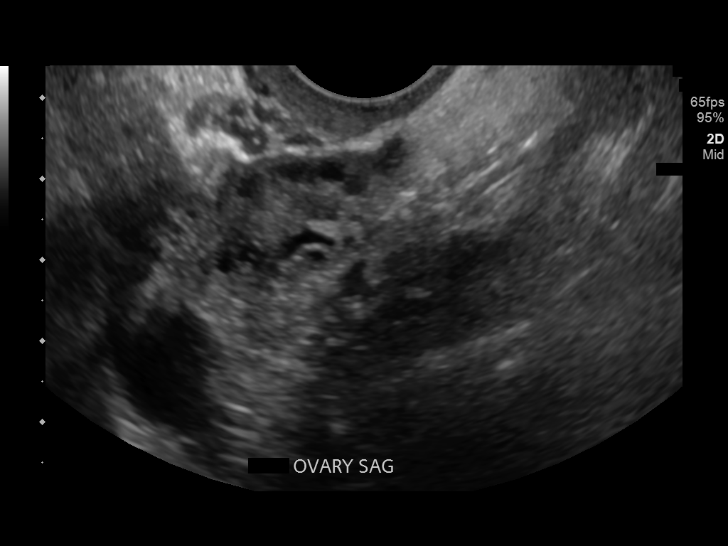
[im 45/67]
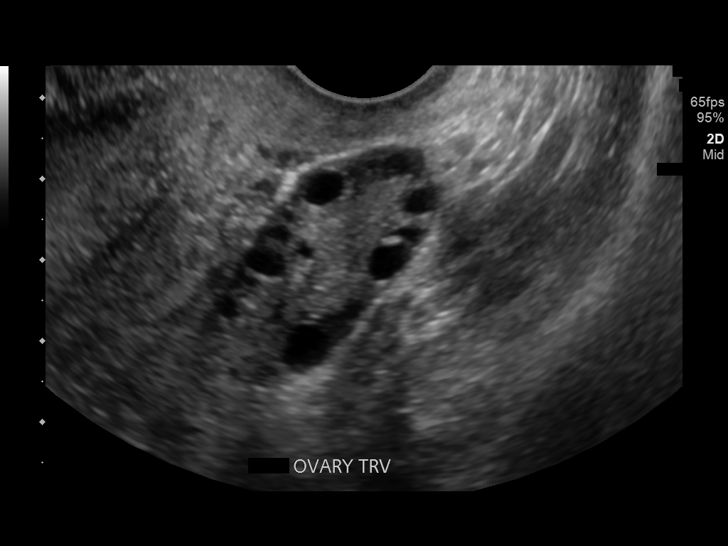
[im 50/67]
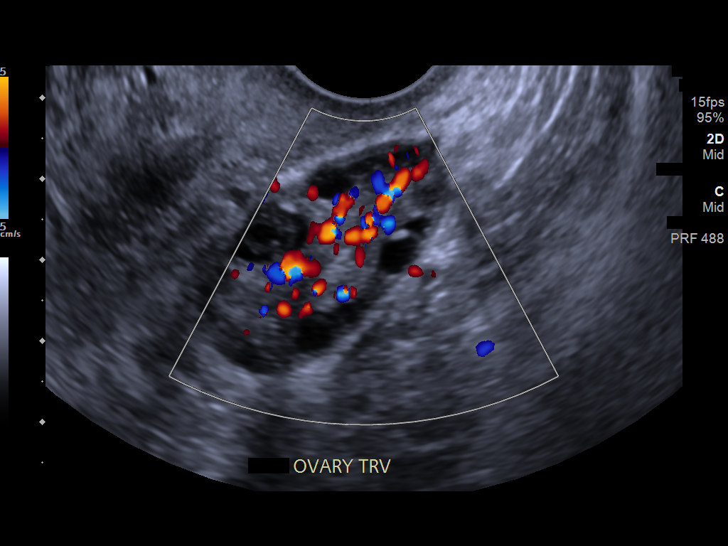
[im 56/67]
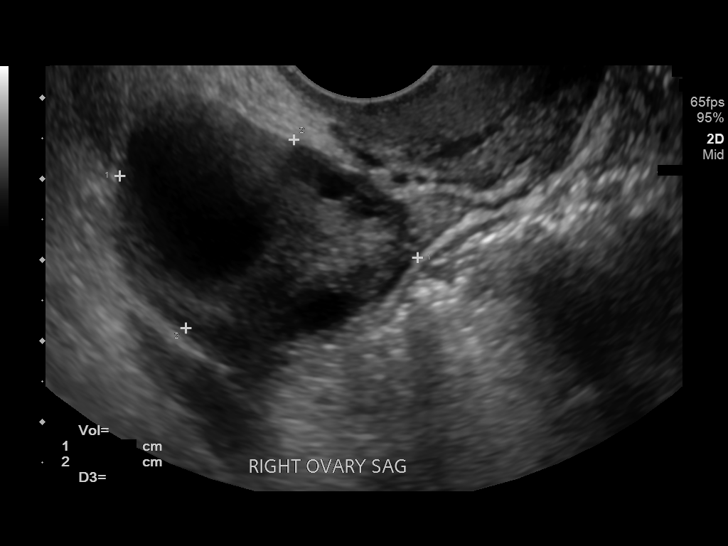
[im 61/67]
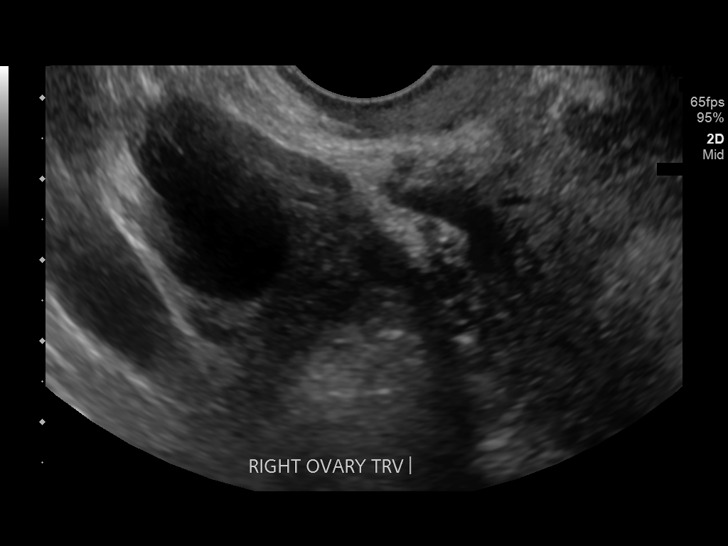
[im 67/67]
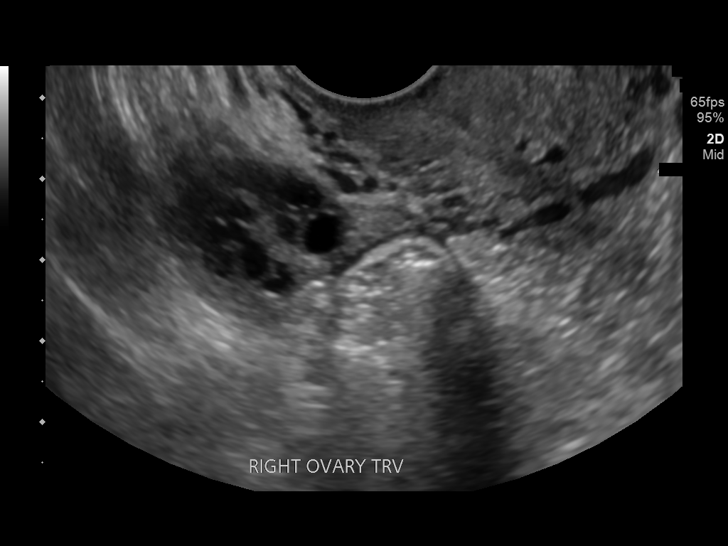

[14 of 25 positions shown; findings below may reference images not displayed]

FINDINGS: Uterus

Measurements: 8.2 cm x 3.4 cm x 4.8 cm = volume: 69.0 mL. No
fibroids or other mass visualized.

Endometrium

Thickness: 5.4 mm.  No focal abnormality visualized.

Right ovary

Measurements: 3.8 cm x 2.7 cm x 2.8 cm = volume: 14.8 mL. Normal
appearance/no adnexal mass.

Left ovary

Measurements: 3.5 cm x 2.2 cm x 2.6 cm = volume: 10.4 mL. Normal
appearance/no adnexal mass.

Other findings

A trace amount of pelvic free fluid is seen.
IMPRESSION: 1. Trace amount of pelvic free fluid which may be physiologic in
nature.
2. Otherwise, normal pelvic ultrasound.

## 2022-03-19 ENCOUNTER — Other Ambulatory Visit (INDEPENDENT_AMBULATORY_CARE_PROVIDER_SITE_OTHER): Payer: Self-pay | Admitting: Nurse Practitioner

## 2022-03-19 DIAGNOSIS — E559 Vitamin D deficiency, unspecified: Secondary | ICD-10-CM

## 2022-03-20 ENCOUNTER — Other Ambulatory Visit (INDEPENDENT_AMBULATORY_CARE_PROVIDER_SITE_OTHER): Payer: Self-pay | Admitting: Nurse Practitioner

## 2022-03-20 DIAGNOSIS — E1169 Type 2 diabetes mellitus with other specified complication: Secondary | ICD-10-CM

## 2022-03-25 ENCOUNTER — Ambulatory Visit (INDEPENDENT_AMBULATORY_CARE_PROVIDER_SITE_OTHER): Payer: Medicaid Other | Admitting: Nurse Practitioner

## 2022-03-25 ENCOUNTER — Other Ambulatory Visit (INDEPENDENT_AMBULATORY_CARE_PROVIDER_SITE_OTHER): Payer: Self-pay | Admitting: Nurse Practitioner

## 2022-03-25 ENCOUNTER — Encounter (INDEPENDENT_AMBULATORY_CARE_PROVIDER_SITE_OTHER): Payer: Self-pay | Admitting: Nurse Practitioner

## 2022-03-25 VITALS — BP 139/93 | HR 80 | Temp 97.7°F | Ht 65.0 in | Wt 316.0 lb

## 2022-03-25 DIAGNOSIS — Z7984 Long term (current) use of oral hypoglycemic drugs: Secondary | ICD-10-CM

## 2022-03-25 DIAGNOSIS — E559 Vitamin D deficiency, unspecified: Secondary | ICD-10-CM

## 2022-03-25 DIAGNOSIS — E7849 Other hyperlipidemia: Secondary | ICD-10-CM

## 2022-03-25 DIAGNOSIS — E1169 Type 2 diabetes mellitus with other specified complication: Secondary | ICD-10-CM

## 2022-03-25 DIAGNOSIS — E669 Obesity, unspecified: Secondary | ICD-10-CM

## 2022-03-25 DIAGNOSIS — Z6841 Body Mass Index (BMI) 40.0 and over, adult: Secondary | ICD-10-CM

## 2022-03-25 DIAGNOSIS — E785 Hyperlipidemia, unspecified: Secondary | ICD-10-CM

## 2022-03-25 MED ORDER — METFORMIN HCL 500 MG PO TABS: 500.0000 mg | ORAL_TABLET | Freq: Every day | ORAL | 0 refills | Status: AC

## 2022-03-25 MED ORDER — VITAMIN D (ERGOCALCIFEROL) 1.25 MG (50000 UNIT) PO CAPS: 50000.0000 [IU] | ORAL_CAPSULE | ORAL | 0 refills | Status: AC

## 2022-03-26 NOTE — Progress Notes (Unsigned)
Chief Complaint:   OBESITY [name] is here to discuss her progress with her obesity treatment plan along with follow-up of her obesity related diagnoses. [name] is on the Category 3 Plan and states she is following her eating plan approximately 100% of the time. [name] states she is walking and doing Just Dance 90-120 minutes 3 times per week.  Today's visit was #: 7 Starting weight: 312 lbs Starting date: 10/03/2021 Today's weight: 316 lbs Today's date: 03/25/2022 Total lbs lost to date: 0 lbs Total lbs lost since last in-office visit: 0  Interim History: [name] was seen here last on 01/28/22 and has gained 5 lbs since then. Since her last visit, her grandmother has passed away and she traveled to [place]. She is working a lot due to bills. For example, she worked the last 8 days. She is drinking water, sparkling water, and rarely soda. She started exercising over the past 2 weeks. Occasional hunger and cravings.  Subjective:   1. Diabetes mellitus type 2 in obese [facility]) Labs discussed during visit today. Avelynn's last A1c was 6.2. She is taking Metformin 500 mg. Denies any side effects. She does not check blood sugars at home. Denies hypoglycemia. Her last eye exam was 1 year ago. She is not on a statin or ACE.  2. Vitamin D deficiency Labs discussed during visit today. [name] is currently taking prescription Vit D 50,000 IU once a week. Denies any side effects.  3. Other hyperlipidemia Labs discussed during visit today. [name] has never been on medication. Family history: father, pgm  Assessment/Plan:   1. Diabetes mellitus type 2 in obese (HCC) We will refill Metformin 500 mg daily for 1 month with 0 refills. Side effects discussed.   -Refill metFORMIN (GLUCOPHAGE) 500 MG tablet; Take 1 tablet (500 mg total) by mouth daily with supper.  Dispense: 30 tablet; Refill: 0  2. Vitamin D deficiency We will refill Vit D 50,000 IU once a week for 1 month with 0 refills.  -Refill  Vitamin D, Ergocalciferol, (DRISDOL) 1.25 MG (50000 UNIT) CAPS capsule; Take 1 capsule (50,000 Units total) by mouth every 7 (seven) days.  Dispense: 4 capsule; Refill: 0  3. other hyperlipidemia Cardiovascular risk and specific lipid/LDL goals reviewed.  We discussed several lifestyle modifications today and [name] will continue to work on diet, exercise and weight loss efforts. Orders and follow up as documented in patient record.   Counseling Intensive lifestyle modifications are the first line treatment for this issue. Dietary changes: Increase soluble fiber. Decrease simple carbohydrates. Exercise changes: Moderate to vigorous-intensity aerobic activity 150 minutes per week if tolerated. Lipid-lowering medications: see documented in medical record.   4. Obesity, current BMI 52.6 Stuti is currently in the action stage of change. As such, her goal is to continue with weight loss efforts. She has agreed to following a lower carbohydrate, vegetable and lean protein rich diet plan.   Exercise goals: As is.  Behavioral modification strategies: increasing lean protein intake, increasing water intake, and planning for success.  [name] has agreed to follow-up with our clinic in 4 weeks. She was informed of the importance of frequent follow-up visits to maximize her success with intensive lifestyle modifications for her multiple health conditions.   Objective:   Blood pressure (!) 139/93, pulse 80, temperature 97.7 F (36.5 C), height 5\' 5"  (1.651 m), weight (!) 316 lb (143.3 kg), SpO2 97 %. Body mass index is 52.59 kg/m.  General: Cooperative, alert, well developed, in no acute distress.  HEENT: Conjunctivae and lids unremarkable. Cardiovascular: Regular rhythm.  Lungs: Normal work of breathing. Neurologic: No focal deficits.   Lab Results  Component Value Date   CREATININE 0.92 01/28/2022   BUN 10 01/28/2022   NA 139 01/28/2022   K 4.4 01/28/2022   CL 103 01/28/2022   CO2 21  01/28/2022   Lab Results  Component Value Date   ALT 19 01/28/2022   AST 15 01/28/2022   ALKPHOS 78 01/28/2022   BILITOT 0.3 01/28/2022   Lab Results  Component Value Date   HGBA1C 6.2 (H) 01/28/2022   HGBA1C 6.5 (H) 10/03/2021   HGBA1C 5.6 11/12/2017   Lab Results  Component Value Date   INSULIN 46.0 (H) 01/28/2022   INSULIN 53.4 (H) 10/03/2021   Lab Results  Component Value Date   TSH 1.560 10/03/2021   Lab Results  Component Value Date   CHOL 200 (H) 01/28/2022   HDL 54 01/28/2022   LDLCALC 131 (H) 01/28/2022   TRIG 83 01/28/2022   Lab Results  Component Value Date   VD25OH 22.2 (L) 01/28/2022   VD25OH 10.2 (L) 10/03/2021   Lab Results  Component Value Date   WBC 6.5 11/21/2020   HGB 13.7 11/21/2020   HCT 40.9 10/03/2021   MCV 86.7 11/21/2020   PLT 366 11/21/2020   Lab Results  Component Value Date   IRON 39 10/03/2021   TIBC 325 10/03/2021   FERRITIN 42 10/03/2021   Attestation Statements:   Reviewed by clinician on day of visit: allergies, medications, problem list, medical history, surgical history, family history, social history, and previous encounter notes.  I, Brendell Tyus, RMA, am acting as transcriptionist for Irene Limbo, FNP.  I have reviewed the above documentation for accuracy and completeness, and I agree with the above. Irene Limbo, FNP

## 2022-03-29 ENCOUNTER — Encounter: Payer: Self-pay | Admitting: Podiatry

## 2022-03-29 ENCOUNTER — Ambulatory Visit (INDEPENDENT_AMBULATORY_CARE_PROVIDER_SITE_OTHER): Payer: Medicaid Other | Admitting: Podiatry

## 2022-03-29 DIAGNOSIS — L600 Ingrowing nail: Secondary | ICD-10-CM | POA: Diagnosis not present

## 2022-03-29 NOTE — Patient Instructions (Signed)

## 2022-03-29 NOTE — Progress Notes (Signed)
  Subjective:  Patient ID: [name], female    DOB: 11/12/1995,   MRN: [id number]  Chief Complaint  Patient presents with   Ingrown Toenail      - L GREAT TOE INGROWN     26 y.o. female presents for concern of left great ingrown toenail that has reoccurred. Here a year ago with Dr. Ardelle Anton to have the nail removed and has now returned. Recently got a pedicure and feels like that caused some irritation . Denies any other pedal complaints. Denies n/v/f/c.   Past Medical History:  Diagnosis Date   High blood pressure    Medical history non-contributory     Objective:  Physical Exam: Vascular: DP/PT pulses 2/4 bilateral. CFT <3 seconds. Normal hair growth on digits. No edema.  Skin. No lacerations or abrasions bilateral feet. Left hallux medial border incurvated. No erythema edema or purulence noted.  Musculoskeletal: MMT 5/5 bilateral lower extremities in DF, PF, Inversion and Eversion. Deceased ROM in DF of ankle joint.  Neurological: Sensation intact to light touch.   Assessment:  No diagnosis found.   Plan:  Patient was evaluated and treated and all questions answered. Patient requesting removal of ingrown nail today. Procedure below.  Discussed procedure and post procedure care and patient expressed understanding.  Will follow-up in 2 weeks for nail check or sooner if any problems arise.    Procedure:  Procedure: partial Nail Avulsion of left hallux medial nail border.  Surgeon: Louann Sjogren, DPM  Pre-op Dx: Ingrown toenail without infection Post-op: Same  Place of Surgery: Office exam room.  Indications for surgery: Painful and ingrown toenail.    The patient is requesting removal of nail with chemical matrixectomy. Risks and complications were discussed with the patient for which they understand and written consent was obtained. Under sterile conditions a total of 3 mL of  1% lidocaine plain was infiltrated in a hallux block fashion. Once anesthetized, the skin  was prepped in sterile fashion. A tourniquet was then applied. Next the medial aspect of hallux nail border was then sharply excised making sure to remove the entire offending nail border.  Next phenol was then applied under standard conditions and copiously irrigated. Silvadene was applied. A dry sterile dressing was applied. After application of the dressing the tourniquet was removed and there is found to be an immediate capillary refill time to the digit. The patient tolerated the procedure well without any complications. Post procedure instructions were discussed the patient for which he verbally understood. Follow-up in two weeks for nail check or sooner if any problems are to arise. Discussed signs/symptoms of infection and directed to call the office immediately should any occur or go directly to the emergency room. In the meantime, encouraged to call the office with any questions, concerns, changes symptoms.   Louann Sjogren, DPM

## 2022-04-17 ENCOUNTER — Encounter (INDEPENDENT_AMBULATORY_CARE_PROVIDER_SITE_OTHER): Payer: Self-pay

## 2022-04-18 ENCOUNTER — Ambulatory Visit: Payer: Medicaid Other | Admitting: Podiatry

## 2022-04-22 ENCOUNTER — Ambulatory Visit (INDEPENDENT_AMBULATORY_CARE_PROVIDER_SITE_OTHER): Payer: Medicaid Other | Admitting: Nurse Practitioner

## 2022-04-28 ENCOUNTER — Other Ambulatory Visit (INDEPENDENT_AMBULATORY_CARE_PROVIDER_SITE_OTHER): Payer: Self-pay | Admitting: Nurse Practitioner

## 2022-04-28 DIAGNOSIS — E1169 Type 2 diabetes mellitus with other specified complication: Secondary | ICD-10-CM

## 2022-04-29 ENCOUNTER — Other Ambulatory Visit (INDEPENDENT_AMBULATORY_CARE_PROVIDER_SITE_OTHER): Payer: Self-pay | Admitting: Nurse Practitioner

## 2022-04-29 DIAGNOSIS — E559 Vitamin D deficiency, unspecified: Secondary | ICD-10-CM
